# Patient Record
Sex: Male | Born: 1965
Health system: Southern US, Community
[De-identification: ages and names within clinical notes are randomized; demographics above are authoritative.]

## PROBLEM LIST (undated history)

## (undated) DIAGNOSIS — J449 Chronic obstructive pulmonary disease, unspecified: Secondary | ICD-10-CM

## (undated) DIAGNOSIS — I10 Essential (primary) hypertension: Secondary | ICD-10-CM

## (undated) HISTORY — DX: Chronic obstructive pulmonary disease, unspecified: J44.9

## (undated) HISTORY — PX: NO PAST SURGERIES: SHX2092

## (undated) HISTORY — DX: Essential (primary) hypertension: I10

---

## 2018-05-04 ENCOUNTER — Telehealth: Payer: Self-pay | Admitting: General Practice

## 2018-05-04 NOTE — Telephone Encounter (Signed)
Patient left a voicemail to cancel appointment. No reschedule at this time.

## 2018-05-05 ENCOUNTER — Ambulatory Visit: Payer: Self-pay | Admitting: Osteopathic Medicine

## 2018-06-21 ENCOUNTER — Ambulatory Visit (INDEPENDENT_AMBULATORY_CARE_PROVIDER_SITE_OTHER): Payer: BLUE CROSS/BLUE SHIELD | Admitting: Physician Assistant

## 2018-06-21 ENCOUNTER — Encounter: Payer: Self-pay | Admitting: Physician Assistant

## 2018-06-21 VITALS — BP 163/86 | HR 59 | Wt 278.0 lb

## 2018-06-21 DIAGNOSIS — I10 Essential (primary) hypertension: Secondary | ICD-10-CM

## 2018-06-21 DIAGNOSIS — G4733 Obstructive sleep apnea (adult) (pediatric): Secondary | ICD-10-CM

## 2018-06-21 DIAGNOSIS — Z125 Encounter for screening for malignant neoplasm of prostate: Secondary | ICD-10-CM | POA: Diagnosis not present

## 2018-06-21 DIAGNOSIS — Z0000 Encounter for general adult medical examination without abnormal findings: Secondary | ICD-10-CM | POA: Diagnosis not present

## 2018-06-21 DIAGNOSIS — Z13220 Encounter for screening for lipoid disorders: Secondary | ICD-10-CM | POA: Diagnosis not present

## 2018-06-21 DIAGNOSIS — M67431 Ganglion, right wrist: Secondary | ICD-10-CM

## 2018-06-21 DIAGNOSIS — Z1159 Encounter for screening for other viral diseases: Secondary | ICD-10-CM

## 2018-06-21 DIAGNOSIS — Z23 Encounter for immunization: Secondary | ICD-10-CM

## 2018-06-21 DIAGNOSIS — Z131 Encounter for screening for diabetes mellitus: Secondary | ICD-10-CM

## 2018-06-21 DIAGNOSIS — Z1211 Encounter for screening for malignant neoplasm of colon: Secondary | ICD-10-CM

## 2018-06-21 HISTORY — DX: Essential (primary) hypertension: I10

## 2018-06-21 HISTORY — DX: Ganglion, right wrist: M67.431

## 2018-06-21 HISTORY — DX: Obstructive sleep apnea (adult) (pediatric): G47.33

## 2018-06-21 MED ORDER — AMLODIPINE BESYLATE 5 MG PO TABS: 5.0000 mg | ORAL_TABLET | Freq: Every day | ORAL | 1 refills | Status: DC

## 2018-06-21 NOTE — Patient Instructions (Addendum)
Ganglion Cyst  A ganglion cyst is a non-cancerous, fluid-filled lump that occurs near a joint or tendon. The cyst grows out of a joint or the lining of a tendon. Ganglion cysts most often develop in the hand or wrist, but they can also develop in the shoulder, elbow, hip, knee, ankle, or foot. Ganglion cysts are ball-shaped or egg-shaped. Their size can range from the size of a pea to larger than a grape. Increased activity may cause the cyst to get bigger because more fluid starts to build up. What are the causes? The exact cause of this condition is not known, but it may be related to:  Inflammation or irritation around the joint.  An injury.  Repetitive movements or overuse.  Arthritis. What increases the risk? You are more likely to develop this condition if:  You are a woman.  You are 40-59 years old. What are the signs or symptoms? The main symptom of this condition is a lump. It most often appears on the hand or wrist. In many cases, there are no other symptoms, but a cyst can sometimes cause:  Tingling.  Pain.  Numbness.  Muscle weakness.  Weak grip.  Less range of motion in a joint. How is this diagnosed? Ganglion cysts are usually diagnosed based on a physical exam. Your health care provider will feel the lump and may shine a light next to it. If it is a ganglion cyst, the light will likely shine through it. Your health care provider may order an X-ray, ultrasound, or MRI to rule out other conditions. How is this treated? Ganglion cysts often go away on their own without treatment. If you have pain or other symptoms, treatment may be needed. Treatment is also needed if the ganglion cyst limits your movement or if it gets infected. Treatment may include:  Wearing a brace or splint on your wrist or finger.  Taking anti-inflammatory medicine.  Having fluid drained from the lump with a needle (aspiration).  Getting a steroid injected into the joint.  Having  surgery to remove the ganglion cyst.  Placing a pad on your shoe or wearing shoes that will not rub against the cyst if it is on your foot. Follow these instructions at home:  Do not press on the ganglion cyst, poke it with a needle, or hit it.  Take over-the-counter and prescription medicines only as told by your health care provider.  If you have a brace or splint: ? Wear it as told by your health care provider. ? Remove it as told by your health care provider. Ask if you need to remove it when you take a shower or a bath.  Watch your ganglion cyst for any changes.  Keep all follow-up visits as told by your health care provider. This is important. Contact a health care provider if:  Your ganglion cyst becomes larger or more painful.  You have pus coming from the lump.  You have weakness or numbness in the affected area.  You have a fever or chills. Get help right away if:  You have a fever and have any of these in the cyst area: ? Increased redness. ? Red streaks. ? Swelling. Summary  A ganglion cyst is a non-cancerous, fluid-filled lump that occurs near a joint or tendon.  Ganglion cysts most often develop in the hand or wrist, but they can also develop in the shoulder, elbow, hip, knee, ankle, or foot.  Ganglion cysts often go away on their own without treatment.  This information is not intended to replace advice given to you by your health care provider. Make sure you discuss any questions you have with your health care provider. Document Released: 04/23/2000 Document Revised: 12/24/2016 Document Reviewed: 12/24/2016 Elsevier Interactive Patient Education  2019 [place], [place] repens tablets and capsules What is this medicine? SAW PALMETTO (saw pal MET oh) is an herbal or dietary supplement. It is promoted to help support prostate health. The FDA has not approved this supplement for any medical use. This supplement may be used for other purposes;  ask your health care provider or pharmacist if you have questions. This medicine may be used for other purposes; ask your health care provider or pharmacist if you have questions. What should I tell my health care provider before I take this medicine? They need to know if you have any of these conditions: -liver disease -prostate cancer -an unusual or allergic reaction to saw palmetto, other herbs, plants, medicines, foods, dyes, or preservatives -pregnant or trying to get pregnant -breast-feeding How should I use this medicine? Take this supplement by mouth with a glass of water. Follow the directions on the package labeling, or take as directed by your health care professional. If this supplement upsets your stomach, take it with food. Do not take this supplement more often than directed. Contact your pediatrician regarding the use of this supplement in children. Special care may be needed. Overdosage: If you think you have taken too much of this medicine contact a poison control center or emergency room at once. NOTE: This medicine is only for you. Do not share this medicine with others. What if I miss a dose? If you miss a dose, take it as soon as you can. If it is almost time for your next dose, take only that dose. Do not take double or extra doses. What may interact with this medicine? -other medicines for the prostate like finasteride, tamsulosin -hormones -warfarin This list may not describe all possible interactions. Give your health care provider a list of all the medicines, herbs, non-prescription drugs, or dietary supplements you use. Also tell them if you smoke, drink alcohol, or use illegal drugs. Some items may interact with your medicine. What should I watch for while using this medicine? Tell your doctor or healthcare professional if your symptoms do not start to get better or if they get worse. If you are scheduled for any medical or dental procedure, tell your healthcare  provider that you are taking this supplement. You may need to stop taking this supplement before the procedure. Herbal or dietary supplements are not regulated like medicines. Rigid quality control standards are not required for dietary supplements. The purity and strength of these products can vary. The safety and effect of this dietary supplement for a certain disease or illness is not well known. This product is not intended to diagnose, treat, cure or prevent any disease. The Food and Drug Administration suggests the following to help consumers protect themselves: -Always read product labels and follow directions. -Natural does not mean a product is safe for humans to take. -Look for products that include USP after the ingredient name. This means that the manufacturer followed the standards of the [place] Pharmacopoeia. -Supplements made or sold by a nationally known food or drug company are more likely to be made under tight controls. You can write to the company for more information about how the product was made. What side effects may I notice from receiving this  medicine? Side effects that you should report to your doctor or health care professional as soon as possible: -allergic reactions like skin rash, itching or hives, swelling of the face, lips, or tongue -breathing problems -dark urine -fever -general ill feeling or flu-like symptoms -light-colored stools -loss of appetite, nausea -right upper belly pain -trouble passing urine or change in the amount of urine -unusually weak or tired -yellowing of the eyes or skin Side effects that usually do not require medical attention (report to your doctor or health care professional if they continue or are bothersome): -breast tenderness or enlargement -diarrhea -headache -stomach upset This list may not describe all possible side effects. Call your doctor for medical advice about side effects. You may report side effects to FDA at  1-800-FDA-1088. Where should I keep my medicine? Keep out of the reach of children. Store at room temperature between 15 and 30 degrees C (59 and 86 degrees F) or as directed on the package label. Protect from moisture. Throw away any unused supplement after the expiration date. NOTE: This sheet is a summary. It may not cover all possible information. If you have questions about this medicine, talk to your doctor, pharmacist, or health care provider.  2019 Elsevier/Gold Standard (2007-12-28 15:15:00)  Hypertension Hypertension, commonly called high blood pressure, is when the force of blood pumping through the arteries is too strong. The arteries are the blood vessels that carry blood from the heart throughout the body. Hypertension forces the heart to work harder to pump blood and may cause arteries to become narrow or stiff. Having untreated or uncontrolled hypertension can cause heart attacks, strokes, kidney disease, and other problems. A blood pressure reading consists of a higher number over a lower number. Ideally, your blood pressure should be below 120/80. The first ("top") number is called the systolic pressure. It is a measure of the pressure in your arteries as your heart beats. The second ("bottom") number is called the diastolic pressure. It is a measure of the pressure in your arteries as the heart relaxes. What are the causes? The cause of this condition is not known. What increases the risk? Some risk factors for high blood pressure are under your control. Others are not. Factors you can change  Smoking.  Having type 2 diabetes mellitus, high cholesterol, or both.  Not getting enough exercise or physical activity.  Being overweight.  Having too much fat, sugar, calories, or salt (sodium) in your diet.  Drinking too much alcohol. Factors that are difficult or impossible to change  Having chronic kidney disease.  Having a family history of high blood pressure.  Age.  Risk increases with age.  Race. You may be at higher risk if you are African-American.  Gender. Men are at higher risk than women before age 9. After age 62, women are at higher risk than men.  Having obstructive sleep apnea.  Stress. What are the signs or symptoms? Extremely high blood pressure (hypertensive crisis) may cause:  Headache.  Anxiety.  Shortness of breath.  Nosebleed.  Nausea and vomiting.  Severe chest pain.  Jerky movements you cannot control (seizures). How is this diagnosed? This condition is diagnosed by measuring your blood pressure while you are seated, with your arm resting on a surface. The cuff of the blood pressure monitor will be placed directly against the skin of your upper arm at the level of your heart. It should be measured at least twice using the same arm. Certain conditions can cause a  difference in blood pressure between your right and left arms. Certain factors can cause blood pressure readings to be lower or higher than normal (elevated) for a short period of time:  When your blood pressure is higher when you are in a health care provider's office than when you are at home, this is called white coat hypertension. Most people with this condition do not need medicines.  When your blood pressure is higher at home than when you are in a health care provider's office, this is called masked hypertension. Most people with this condition may need medicines to control blood pressure. If you have a high blood pressure reading during one visit or you have normal blood pressure with other risk factors:  You may be asked to return on a different day to have your blood pressure checked again.  You may be asked to monitor your blood pressure at home for 1 week or longer. If you are diagnosed with hypertension, you may have other blood or imaging tests to help your health care provider understand your overall risk for other conditions. How is this  treated? This condition is treated by making healthy lifestyle changes, such as eating healthy foods, exercising more, and reducing your alcohol intake. Your health care provider may prescribe medicine if lifestyle changes are not enough to get your blood pressure under control, and if:  Your systolic blood pressure is above 130.  Your diastolic blood pressure is above 80. Your personal target blood pressure may vary depending on your medical conditions, your age, and other factors. Follow these instructions at home: Eating and drinking   Eat a diet that is high in fiber and potassium, and low in sodium, added sugar, and fat. An example eating plan is called the DASH (Dietary Approaches to Stop Hypertension) diet. To eat this way: ? Eat plenty of fresh fruits and vegetables. Try to fill half of your plate at each meal with fruits and vegetables. ? Eat whole grains, such as whole wheat pasta, brown rice, or whole grain bread. Fill about one quarter of your plate with whole grains. ? Eat or drink low-fat dairy products, such as skim milk or low-fat yogurt. ? Avoid fatty cuts of meat, processed or cured meats, and poultry with skin. Fill about one quarter of your plate with lean proteins, such as fish, chicken without skin, beans, eggs, and tofu. ? Avoid premade and processed foods. These tend to be higher in sodium, added sugar, and fat.  Reduce your daily sodium intake. Most people with hypertension should eat less than 1,500 mg of sodium a day.  Limit alcohol intake to no more than 1 drink a day for nonpregnant women and 2 drinks a day for men. One drink equals 12 oz of beer, 5 oz of wine, or 1 oz of hard liquor. Lifestyle   Work with your health care provider to maintain a healthy body weight or to lose weight. Ask what an ideal weight is for you.  Get at least 30 minutes of exercise that causes your heart to beat faster (aerobic exercise) most days of the week. Activities may include  walking, swimming, or biking.  Include exercise to strengthen your muscles (resistance exercise), such as pilates or lifting weights, as part of your weekly exercise routine. Try to do these types of exercises for 30 minutes at least 3 days a week.  Do not use any products that contain nicotine or tobacco, such as cigarettes and e-cigarettes. If you need help  quitting, ask your health care provider.  Monitor your blood pressure at home as told by your health care provider.  Keep all follow-up visits as told by your health care provider. This is important. Medicines  Take over-the-counter and prescription medicines only as told by your health care provider. Follow directions carefully. Blood pressure medicines must be taken as prescribed.  Do not skip doses of blood pressure medicine. Doing this puts you at risk for problems and can make the medicine less effective.  Ask your health care provider about side effects or reactions to medicines that you should watch for. Contact a health care provider if:  You think you are having a reaction to a medicine you are taking.  You have headaches that keep coming back (recurring).  You feel dizzy.  You have swelling in your ankles.  You have trouble with your vision. Get help right away if:  You develop a severe headache or confusion.  You have unusual weakness or numbness.  You feel faint.  You have severe pain in your chest or abdomen.  You vomit repeatedly.  You have trouble breathing. Summary  Hypertension is when the force of blood pumping through your arteries is too strong. If this condition is not controlled, it may put you at risk for serious complications.  Your personal target blood pressure may vary depending on your medical conditions, your age, and other factors. For most people, a normal blood pressure is less than 120/80.  Hypertension is treated with lifestyle changes, medicines, or a combination of both. Lifestyle  changes include weight loss, eating a healthy, low-sodium diet, exercising more, and limiting alcohol. This information is not intended to replace advice given to you by your health care provider. Make sure you discuss any questions you have with your health care provider. Document Released: 04/26/2005 Document Revised: 03/24/2016 Document Reviewed: 03/24/2016 Elsevier Interactive Patient Education  2019 [employer].

## 2018-06-21 NOTE — Progress Notes (Signed)
New Patient Office Visit  Subjective:  Patient ID: Ernest Fuentes, male    DOB: November 20, 1965  Age: 53 y.o. MRN: [id number]  CC:  Chief Complaint  Patient presents with  . Establish Care    HPI Ernest Fuentes presents for establish care.   He "just wants to get healthy".   He does want me to evaluate a mass on his right wrist that has been there for about 1 year. Not bothersome. He works as a Scientist, water quality.   Hx of OSA does not use CPAP. Hated it. Is working on weight loss he used to be over 300lbs.   BP elevated today. No CP, headaches, palpitations, vision changes. Never been on medication. Mother and father both had hypertension. His sister just had a stroke.    Past Medical History:  Diagnosis Date  . COPD (chronic obstructive pulmonary disease) (HCC)   . Hypertension     History reviewed. No pertinent surgical history.  Family History  Problem Relation Age of Onset  . Hypertension Other   . Diabetes Other   . Cancer Other   . Hypertension Mother   . Stroke Father   . Hypertension Father   . Stroke Sister     Social History   Socioeconomic History  . Marital status: Single    Spouse name: Not on file  . Number of children: Not on file  . Years of education: Not on file  . Highest education level: Not on file  Occupational History  . Not on file  Social Needs  . Financial resource strain: Not on file  . Food insecurity:    Worry: Not on file    Inability: Not on file  . Transportation needs:    Medical: Not on file    Non-medical: Not on file  Tobacco Use  . Smoking status: Former Games developer  . Smokeless tobacco: Current User  Substance and Sexual Activity  . Alcohol use: Yes  . Drug use: Never  . Sexual activity: Yes  Lifestyle  . Physical activity:    Days per week: Not on file    Minutes per session: Not on file  . Stress: Not on file  Relationships  . Social connections:    Talks on phone: Not on file    Gets together: Not on file    Attends  religious service: Not on file    Active member of club or organization: Not on file    Attends meetings of clubs or organizations: Not on file    Relationship status: Not on file  . Intimate partner violence:    Fear of current or ex partner: Not on file    Emotionally abused: Not on file    Physically abused: Not on file    Forced sexual activity: Not on file  Other Topics Concern  . Not on file  Social History Narrative  . Not on file    ROS Review of Systems  All other systems reviewed and are negative.   Objective:   Today's Vitals: BP (!) 163/86   Pulse (!) 59   Wt 278 lb (126.1 kg)   Physical Exam Vitals signs reviewed.  Constitutional:      Appearance: Normal appearance. He is obese.  HENT:     Head: Normocephalic and atraumatic.  Neck:     Musculoskeletal: Normal range of motion.  Cardiovascular:     Rate and Rhythm: Normal rate and regular rhythm.     Pulses: Normal  pulses.     Heart sounds: Normal heart sounds. No murmur.  Pulmonary:     Effort: Pulmonary effort is normal.     Breath sounds: Normal breath sounds.  Lymphadenopathy:     Cervical: No cervical adenopathy.  Skin:    Comments: 1.5cm by 1.5cm mobile cyst of dorsum of right wrist.   Neurological:     General: No focal deficit present.     Mental Status: He is alert and oriented to person, place, and time.  Psychiatric:        Mood and Affect: Mood normal.        Behavior: Behavior normal.     Assessment & Plan:  .[name] Depression screen [facility] 2/9 06/21/2018  Decreased Interest 2  Down, Depressed, Hopeless 0  PHQ - 2 Score 2  Altered sleeping 0  Tired, decreased energy 0  Change in appetite 0  Feeling bad or failure about yourself  0  Trouble concentrating 0  Moving slowly or fidgety/restless 0  Suicidal thoughts 0  PHQ-9 Score 2  Difficult doing work/chores Not difficult at all    Problem List Items Addressed This Visit      Unprioritized   OSA (obstructive sleep apnea)    Ganglion cyst of dorsum of right wrist    Other Visit Diagnoses    Essential hypertension    -  Primary   Relevant Medications   amLODipine (NORVASC) 5 MG tablet   Screening for diabetes mellitus       Relevant Orders   COMPLETE METABOLIC PANEL WITH GFR   Screening for lipid disorders       Relevant Orders   Lipid Panel w/reflex Direct LDL   Colon cancer screening       Relevant Orders   Cologuard   Need for hepatitis C screening test       Relevant Orders   Hepatitis C Antibody   Prostate cancer screening       Relevant Orders   PSA   Preventative health care       Relevant Orders   Lipid Panel w/reflex Direct LDL   COMPLETE METABOLIC PANEL WITH GFR   Cologuard   Hepatitis C Antibody   PSA   CBC   Need for shingles vaccine       Relevant Orders   Varicella-zoster vaccine IM (Shingrix) (Completed)      Outpatient Encounter Medications as of 06/21/2018  Medication Sig  . amLODipine (NORVASC) 5 MG tablet Take 1 tablet (5 mg total) by mouth daily.   No facility-administered encounter medications on file as of 06/21/2018.    Discussed BP. Needs to start medication. Given CCB.Discussed low salt diet. Follow up in 1 month. Continue to walk and work on weight loss. OSA can contribute to HTN. Pt refuses evaluation and use of CPAP.   Fasting labs ordered.   Pt agreed to shingles vaccine.   Discussed benign ganglion cyst. HO given. Follow up with sports medicine if would like drained.   Pt declined colonoscopy but agreed to cologuard. No person hx or family hx of colon cancer.   Follow-up: Return in about 4 weeks (around 07/19/2018).   Tandy Gaw, PA-C

## 2018-06-22 LAB — CBC
HCT: 42.5 % (ref 38.5–50.0)
HEMOGLOBIN: 14.3 g/dL (ref 13.2–17.1)
MCH: 30.6 pg (ref 27.0–33.0)
MCHC: 33.6 g/dL (ref 32.0–36.0)
MCV: 91 fL (ref 80.0–100.0)
MPV: 11.6 fL (ref 7.5–12.5)
Platelets: 288 10*3/uL (ref 140–400)
RBC: 4.67 10*6/uL (ref 4.20–5.80)
RDW: 11.9 % (ref 11.0–15.0)
WBC: 6.5 10*3/uL (ref 3.8–10.8)

## 2018-06-22 LAB — COMPLETE METABOLIC PANEL WITH GFR
AG Ratio: 1.2 (calc) (ref 1.0–2.5)
ALT: 14 U/L (ref 9–46)
AST: 21 U/L (ref 10–35)
Albumin: 4.2 g/dL (ref 3.6–5.1)
Alkaline phosphatase (APISO): 107 U/L (ref 35–144)
BUN: 12 mg/dL (ref 7–25)
CO2: 30 mmol/L (ref 20–32)
Calcium: 10 mg/dL (ref 8.6–10.3)
Chloride: 103 mmol/L (ref 98–110)
Creat: 1 mg/dL (ref 0.70–1.33)
GFR, Est African American: 98 mL/min/{1.73_m2} (ref >=60)
GFR, Est Non African American: 85 mL/min/{1.73_m2} (ref >=60)
Globulin: 3.4 g/dL (calc) (ref 1.9–3.7)
Glucose, Bld: 100 mg/dL — ABNORMAL HIGH (ref 65–99)
Potassium: 4.6 mmol/L (ref 3.5–5.3)
Sodium: 139 mmol/L (ref 135–146)
Total Bilirubin: 0.8 mg/dL (ref 0.2–1.2)
Total Protein: 7.6 g/dL (ref 6.1–8.1)

## 2018-06-22 LAB — PSA: PSA: 0.8 ng/mL (ref <=4.0)

## 2018-06-22 LAB — LIPID PANEL W/REFLEX DIRECT LDL
CHOLESTEROL: 226 mg/dL — AB (ref <200)
HDL: 42 mg/dL (ref >=40)
LDL CHOLESTEROL (CALC): 162 mg/dL — AB
Non-HDL Cholesterol (Calc): 184 mg/dL (calc) — ABNORMAL HIGH (ref <130)
Total CHOL/HDL Ratio: 5.4 (calc) — ABNORMAL HIGH (ref <5.0)
Triglycerides: 102 mg/dL (ref <150)

## 2018-06-22 LAB — HEPATITIS C ANTIBODY
Hepatitis C Ab: NONREACTIVE
SIGNAL TO CUT-OFF: 0.19 (ref <1.00)

## 2018-06-23 ENCOUNTER — Encounter: Payer: Self-pay | Admitting: Physician Assistant

## 2018-06-23 DIAGNOSIS — E785 Hyperlipidemia, unspecified: Secondary | ICD-10-CM

## 2018-06-23 DIAGNOSIS — F17200 Nicotine dependence, unspecified, uncomplicated: Secondary | ICD-10-CM | POA: Insufficient documentation

## 2018-06-23 HISTORY — DX: Hyperlipidemia, unspecified: E78.5

## 2018-06-23 NOTE — Progress Notes (Signed)
Call pt: your cholesterol is elevated and with high blood pressure and smoking that puts your overall 10 year CV risk at 19.3 percent. We should start a cholesterol lowering drug called lipitor. Can I send to the pharmacy?  Kidney, liver, glucose look good.  PSA looks good.

## 2018-06-26 ENCOUNTER — Other Ambulatory Visit: Payer: Self-pay

## 2018-06-26 MED ORDER — AMLODIPINE BESYLATE 5 MG PO TABS: 5.0000 mg | ORAL_TABLET | Freq: Every day | ORAL | 1 refills | Status: DC

## 2018-06-30 ENCOUNTER — Telehealth: Payer: Self-pay

## 2018-06-30 ENCOUNTER — Other Ambulatory Visit: Payer: Self-pay | Admitting: Physician Assistant

## 2018-06-30 MED ORDER — ATORVASTATIN CALCIUM 40 MG PO TABS: 40.0000 mg | ORAL_TABLET | Freq: Every day | ORAL | 3 refills | Status: DC

## 2018-06-30 NOTE — Telephone Encounter (Signed)
Patient advised.

## 2018-06-30 NOTE — Telephone Encounter (Signed)
Sent!

## 2018-06-30 NOTE — Telephone Encounter (Signed)
Lemon walked in asking about the cholesterol lowering medication. He was told it would be sent in. Please advise.

## 2018-06-30 NOTE — Progress Notes (Signed)
l °

## 2018-07-13 DIAGNOSIS — T466X5A Adverse effect of antihyperlipidemic and antiarteriosclerotic drugs, initial encounter: Secondary | ICD-10-CM | POA: Diagnosis not present

## 2018-07-13 DIAGNOSIS — R21 Rash and other nonspecific skin eruption: Secondary | ICD-10-CM | POA: Diagnosis not present

## 2018-07-13 DIAGNOSIS — E785 Hyperlipidemia, unspecified: Secondary | ICD-10-CM | POA: Diagnosis not present

## 2018-07-13 DIAGNOSIS — Z79899 Other long term (current) drug therapy: Secondary | ICD-10-CM | POA: Diagnosis not present

## 2018-07-13 DIAGNOSIS — T7840XA Allergy, unspecified, initial encounter: Secondary | ICD-10-CM | POA: Diagnosis not present

## 2018-07-13 DIAGNOSIS — I10 Essential (primary) hypertension: Secondary | ICD-10-CM | POA: Diagnosis not present

## 2018-07-14 ENCOUNTER — Encounter: Payer: Self-pay | Admitting: Osteopathic Medicine

## 2018-07-14 ENCOUNTER — Ambulatory Visit (INDEPENDENT_AMBULATORY_CARE_PROVIDER_SITE_OTHER): Payer: BLUE CROSS/BLUE SHIELD | Admitting: Osteopathic Medicine

## 2018-07-14 VITALS — BP 134/73 | HR 71 | Temp 98.2°F | Wt 277.1 lb

## 2018-07-14 DIAGNOSIS — E782 Mixed hyperlipidemia: Secondary | ICD-10-CM | POA: Diagnosis not present

## 2018-07-14 DIAGNOSIS — T466X5A Adverse effect of antihyperlipidemic and antiarteriosclerotic drugs, initial encounter: Secondary | ICD-10-CM | POA: Diagnosis not present

## 2018-07-14 MED ORDER — PRAVASTATIN SODIUM 20 MG PO TABS: 20.0000 mg | ORAL_TABLET | Freq: Every day | ORAL | 1 refills | Status: DC

## 2018-07-14 NOTE — Progress Notes (Signed)
HPI: Ernest Fuentes is a 53 y.o. male who  has a past medical history of COPD (chronic obstructive pulmonary disease) (HCC) and Hypertension.  he presents to [facility] today, 07/14/18,  for chief complaint of:  Rash w/ atorvastatin   Started atorvastatin, rash developed, stopped and rash resolved, restarted and rash came back a bit worse.  He went to the ER last night and was treated with steroid shot, prescribed prednisone taper.  He is feeling a lot better now.    At today's visit 07/14/18 ... PMH, PSH, FH reviewed and updated as needed.  Current medication list and allergy/intolerance hx reviewed and updated as needed. (See remainder of HPI, ROS, Phys Exam below)  Allergies  Allergen Reactions  . Atorvastatin Rash             ASSESSMENT/PLAN: The primary encounter diagnosis was Adverse effect of statin. A diagnosis of Mixed hyperlipidemia was also pertinent to this visit.   Rash with atorvastatin but no signs of anaphylaxis.  Discussed option of trying another oral statin medication, which may risk another reaction, versus trying 1 of the PCSK9 inhibitors such as Repatha or Praluent.  Patient states he would rather risk another oral medication first.  We will go ahead and send pravastatin.  Plan to follow-up with [facility] after getting labs done to recheck cholesterol in 4 to 6 weeks or so.  Alert [place] ASAP if any issues with pravastatin medication.  Orders Placed This Encounter  Procedures  . COMPLETE METABOLIC PANEL WITH GFR  . Lipid panel     Meds ordered this encounter  Medications  . pravastatin (PRAVACHOL) 20 MG tablet    Sig: Take 1 tablet (20 mg total) by mouth daily.    Dispense:  30 tablet    Refill:  1   The 10-year ASCVD risk score Ernest Fuentes., et al., 2013) is: 12.4%   Values used to calculate the score:     Age: 35 years     Sex: Male     Is Non-Hispanic African American: Yes     Diabetic: No     Tobacco smoker:  No     Systolic Blood Pressure: 134 mmHg     Is BP treated: Yes     HDL Cholesterol: 42 mg/dL     Total Cholesterol: 226 mg/dL   Patient Instructions  We will try different oral medication to help treat high cholesterol and reduce long-term risk of heart attack/stroke.  Given your reaction to atorvastatin, you may be at risk of developing a similar reaction with pravastatin.  If rash or other concerning symptoms develop, please seek medical care ASAP!  Other good option would be injectable medications, option of Praluent or Repatha.  Please let us know if you would like to try 1 of these medications, typically an injection that patients can administer at home every 2 to 4 weeks.         Follow-up plan: Return in about 6 weeks (around 08/25/2018) for lab visit only, then follow-up with Ernest Fuentes in 3-5 days after blood draw. See [place] or call sooner if need.                                                 ################################################# ################################################# ################################################# #################################################    Current Meds  Medication Sig  .  amLODipine (NORVASC) 5 MG tablet Take 1 tablet (5 mg total) by mouth daily.    Allergies  Allergen Reactions  . Atorvastatin Rash       Review of Systems:  Constitutional: No fever/chills  HEENT: No  headache, no vision change  Cardiac: No  chest pain, No  pressure, No palpitations  Respiratory:  No  shortness of breath. No  Cough  Gastrointestinal: No  abdominal pain  Musculoskeletal: No new myalgia/arthralgia  Skin: +Rash   Exam:  BP 134/73 (BP Location: Left Arm, Patient Position: Sitting, Cuff Size: Large)   Pulse 71   Temp 98.2 F (36.8 C) (Oral)   Wt 277 lb 1.6 oz (125.7 kg)   Constitutional: VS see above. General Appearance: alert, well-developed, well-nourished, NAD  Eyes:  Normal lids and conjunctive, non-icteric sclera  Ears, Nose, Mouth, Throat: MMM, Normal external inspection ears/nares/mouth/lips/gums.  Neck: No masses, trachea midline.   Respiratory: Normal respiratory effort. no wheeze, no rhonchi, no rales  Cardiovascular: S1/S2 normal, no murmur, no rub/gallop auscultated. RRR.   Musculoskeletal: Gait normal. Symmetric and independent movement of all extremities  Neurological: Normal balance/coordination. No tremor.  Skin: warm, dry, intact. Mild hives reaction on forearms   Psychiatric: Normal judgment/insight. Normal mood and affect. Oriented x3.       Visit summary with medication list and pertinent instructions was printed for patient to review, patient was advised to alert [place] if any updates are needed. All questions at time of visit were answered - patient instructed to contact office with any additional concerns. ER/RTC precautions were reviewed with the patient and understanding verbalized.    Please note: voice recognition software was used to produce this document, and typos may escape review. Please contact Dr. Lyn Fuentes for any needed clarifications.    Follow up plan: Return in about 6 weeks (around 08/25/2018) for lab visit only, then follow-up with Ernest Fuentes in 3-5 days after blood draw. See [place] or call sooner if need.

## 2018-07-14 NOTE — Patient Instructions (Signed)
We will try different oral medication to help treat high cholesterol and reduce long-term risk of heart attack/stroke.  Given your reaction to atorvastatin, you may be at risk of developing a similar reaction with pravastatin.  If rash or other concerning symptoms develop, please seek medical care ASAP!  Other good option would be injectable medications, option of Praluent or Repatha.  Please let us know if you would like to try 1 of these medications, typically an injection that patients can administer at home every 2 to 4 weeks.

## 2018-08-15 ENCOUNTER — Ambulatory Visit (INDEPENDENT_AMBULATORY_CARE_PROVIDER_SITE_OTHER): Payer: BLUE CROSS/BLUE SHIELD | Admitting: Osteopathic Medicine

## 2018-08-15 ENCOUNTER — Encounter: Payer: Self-pay | Admitting: Osteopathic Medicine

## 2018-08-15 VITALS — BP 165/85 | HR 66 | Temp 98.1°F | Wt 268.6 lb

## 2018-08-15 DIAGNOSIS — R32 Unspecified urinary incontinence: Secondary | ICD-10-CM

## 2018-08-15 LAB — POCT URINALYSIS DIPSTICK
Bilirubin, UA: NEGATIVE
Blood, UA: NEGATIVE
Glucose, UA: NEGATIVE
Ketones, UA: NEGATIVE
Leukocytes, UA: NEGATIVE
Nitrite, UA: NEGATIVE
Protein, UA: NEGATIVE
Spec Grav, UA: 1.015 (ref 1.010–1.025)
Urobilinogen, UA: 0.2 E.U./dL
pH, UA: 6.5 (ref 5.0–8.0)

## 2018-08-15 MED ORDER — TERAZOSIN HCL 5 MG PO CAPS: 5.0000 mg | ORAL_CAPSULE | Freq: Every day | ORAL | 0 refills | Status: DC

## 2018-08-15 NOTE — Progress Notes (Signed)
HPI: Ernest Fuentes is a 53 y.o. male who  has a past medical history of COPD (chronic obstructive pulmonary disease) (HCC) and Hypertension.  he presents to [facility] today, 08/15/18,  for chief complaint of:  Urinary problem    . Quality: Urinary urgency and occasional incontinence, epidose of enuresis last night.  . Context: no hx BPH or other urinary issues, only meds are amlodipine and pravastatin, no recent OTC meds, recent PSA and renal fxn 1 month ago was WNL.  . Severity/Duration: 2 weeks, stable over that time. . Assoc signs/symptoms: Low back pain soreness but no injury, no saddle anesthesia, no stool incontinence.        At today's visit 08/15/18 ... PMH, PSH, FH reviewed and updated as needed.  Current medication list and allergy/intolerance hx reviewed and updated as needed. (See remainder of HPI, ROS, Phys Exam below)        ASSESSMENT/PLAN: The encounter diagnosis was Urinary incontinence, unspecified type.   Orders Placed This Encounter  Procedures  . Urine Culture  . Urinalysis, microscopic only  . COMPLETE METABOLIC PANEL WITH GFR  . PSA, Total with Reflex to PSA, Free  . Ambulatory referral to Urology  . POCT Urinalysis Dipstick     Meds ordered this encounter  Medications  . terazosin (HYTRIN) 5 MG capsule    Sig: Take 1 capsule (5 mg total) by mouth at bedtime.    Dispense:  90 capsule    Refill:  0    Patient Instructions  Urinary incontinence concerning for bladder spasm, overflow incontinence due to enlarged prostate or other urinary retention, urinary tract infection, other possible causes. I think we should start a medication for possible prostate problems, this medicine will also help blood pressure. Let's wait for urine and blood tests to come back, and let's get you seen by a urologist (bladder specialist) for further evaluation.        Follow-up plan: Return for recheck BP as directed with  Lesly Rubenstein, see [place] sooner if needed .                                                 ################################################# ################################################# ################################################# #################################################    Current Meds  Medication Sig  . amLODipine (NORVASC) 5 MG tablet Take 1 tablet (5 mg total) by mouth daily.  . pravastatin (PRAVACHOL) 20 MG tablet Take 1 tablet (20 mg total) by mouth daily.    Allergies  Allergen Reactions  . Atorvastatin Rash       Review of Systems:  Constitutional: No recent illness, no fever/shilld  HEENT: No  headache, no vision change  Cardiac: No  chest pain, No  pressure, No palpitations  Respiratory:  No  shortness of breath. No  Cough  Gastrointestinal: No  abdominal pain, no change on bowel habits  Musculoskeletal: No new myalgia/arthralgia except mild low back pain   Skin: No  Rash  Hem/Onc: No  easy bruising/bleeding, No  abnormal lumps/bumps  Neurologic: No  weakness, No  Dizziness  Psychiatric: No  concerns with depression, No  concerns with anxiety  Exam:  BP (!) 165/85 (BP Location: Left Arm, Patient Position: Sitting, Cuff Size: Large)   Pulse 66   Temp 98.1 F (36.7 C) (Oral)   Wt 268 lb 9.6 oz (121.8 kg)   Constitutional: VS see  above. General Appearance: alert, well-developed, well-nourished, NAD  Eyes: Normal lids and conjunctive, non-icteric sclera  Ears, Nose, Mouth, Throat: MMM, Normal external inspection ears/nares/mouth/lips/gums.  Neck: No masses, trachea midline.   Respiratory: Normal respiratory effort.  Musculoskeletal: Gait normal. Symmetric and independent movement of all extremities  Abdominal: non-tender, non-distended. Rectal exam normal tone, normal prostate   Neurological: Normal balance/coordination. No tremor.  Skin: warm, dry, intact.   Psychiatric: Normal  judgment/insight. Normal mood and affect. Oriented x3.       Visit summary with medication list and pertinent instructions was printed for patient to review, patient was advised to alert us if any updates are needed. All questions at time of visit were answered - patient instructed to contact office with any additional concerns. ER/RTC precautions were reviewed with the patient and understanding verbalized.      Please note: voice recognition software was used to produce this document, and typos may escape review. Please contact Dr. Lyn HollingsheadAlexander for any needed clarifications.    Follow up plan: Return for recheck BP as directed with [name], see us sooner if needed .

## 2018-08-15 NOTE — Patient Instructions (Signed)
Urinary incontinence concerning for bladder spasm, overflow incontinence due to enlarged prostate or other urinary retention, urinary tract infection, other possible causes. I think we should start a medication for possible prostate problems, this medicine will also help blood pressure. Let's wait for urine and blood tests to come back, and let's get you seen by a urologist (bladder specialist) for further evaluation.

## 2018-08-16 LAB — COMPLETE METABOLIC PANEL WITH GFR
AG Ratio: 1.2 (calc) (ref 1.0–2.5)
ALT: 10 U/L (ref 9–46)
AST: 22 U/L (ref 10–35)
Albumin: 4.1 g/dL (ref 3.6–5.1)
Alkaline phosphatase (APISO): 109 U/L (ref 35–144)
BUN: 12 mg/dL (ref 7–25)
CO2: 29 mmol/L (ref 20–32)
Calcium: 9.8 mg/dL (ref 8.6–10.3)
Chloride: 103 mmol/L (ref 98–110)
Creat: 0.97 mg/dL (ref 0.70–1.33)
GFR, Est African American: 102 mL/min/{1.73_m2} (ref >=60)
GFR, Est Non African American: 88 mL/min/{1.73_m2} (ref >=60)
Globulin: 3.3 g/dL (calc) (ref 1.9–3.7)
Glucose, Bld: 98 mg/dL (ref 65–99)
Potassium: 4.6 mmol/L (ref 3.5–5.3)
Sodium: 139 mmol/L (ref 135–146)
Total Bilirubin: 0.9 mg/dL (ref 0.2–1.2)
Total Protein: 7.4 g/dL (ref 6.1–8.1)

## 2018-08-16 LAB — URINALYSIS, MICROSCOPIC ONLY
Bacteria, UA: NONE SEEN /HPF
Hyaline Cast: NONE SEEN /LPF
RBC / HPF: NONE SEEN /HPF (ref 0–2)
Squamous Epithelial / LPF: NONE SEEN /HPF (ref <=5)
WBC, UA: NONE SEEN /HPF (ref 0–5)

## 2018-08-16 LAB — URINE CULTURE
MICRO NUMBER:: 380826
Result:: NO GROWTH
SPECIMEN QUALITY:: ADEQUATE

## 2018-08-16 LAB — PSA, TOTAL WITH REFLEX TO PSA, FREE: PSA, Total: 1.4 ng/mL (ref <=4.0)

## 2018-08-22 ENCOUNTER — Ambulatory Visit: Payer: BLUE CROSS/BLUE SHIELD | Admitting: Physician Assistant

## 2018-08-22 ENCOUNTER — Encounter: Payer: Self-pay | Admitting: Neurology

## 2018-08-22 ENCOUNTER — Telehealth: Payer: Self-pay | Admitting: Neurology

## 2018-08-22 NOTE — Telephone Encounter (Signed)
Received note from [employer] that patient has not completed Cologuard and they have been unable to reach him. Letter mailed to patient to encourage him to complete testing and reach out to us/the company with any questions.

## 2018-09-04 ENCOUNTER — Other Ambulatory Visit: Payer: Self-pay | Admitting: Physician Assistant

## 2018-09-11 ENCOUNTER — Ambulatory Visit: Payer: BLUE CROSS/BLUE SHIELD | Admitting: Physician Assistant

## 2018-09-11 ENCOUNTER — Encounter: Payer: Self-pay | Admitting: Physician Assistant

## 2018-09-11 VITALS — BP 131/72 | HR 64 | Temp 98.4°F | Ht 72.0 in | Wt 273.0 lb

## 2018-09-11 DIAGNOSIS — L240 Irritant contact dermatitis due to detergents: Secondary | ICD-10-CM

## 2018-09-11 DIAGNOSIS — Z8042 Family history of malignant neoplasm of prostate: Secondary | ICD-10-CM

## 2018-09-11 DIAGNOSIS — R32 Unspecified urinary incontinence: Secondary | ICD-10-CM

## 2018-09-11 DIAGNOSIS — I10 Essential (primary) hypertension: Secondary | ICD-10-CM

## 2018-09-11 DIAGNOSIS — E782 Mixed hyperlipidemia: Secondary | ICD-10-CM

## 2018-09-11 HISTORY — DX: Family history of malignant neoplasm of prostate: Z80.42

## 2018-09-11 MED ORDER — AMLODIPINE BESYLATE 5 MG PO TABS: 5.0000 mg | ORAL_TABLET | Freq: Every day | ORAL | 1 refills | Status: DC

## 2018-09-11 MED ORDER — TRIAMCINOLONE ACETONIDE 0.1 % EX CREA: 1.0000 "application " | TOPICAL_CREAM | Freq: Two times a day (BID) | CUTANEOUS | 0 refills | Status: DC

## 2018-09-11 MED ORDER — PRAVASTATIN SODIUM 20 MG PO TABS: 20.0000 mg | ORAL_TABLET | Freq: Every day | ORAL | 3 refills | Status: DC

## 2018-09-11 NOTE — Patient Instructions (Signed)
Contact Dermatitis  Dermatitis is redness, soreness, and swelling (inflammation) of the skin. Contact dermatitis is a reaction to something that touches the skin.  There are two types of contact dermatitis:   Irritant contact dermatitis. This happens when something bothers (irritates) your skin, like soap.   Allergic contact dermatitis. This is caused when you are exposed to something that you are allergic to, such as poison ivy.  What are the causes?   Common causes of irritant contact dermatitis include:  ? Makeup.  ? Soaps.  ? Detergents.  ? Bleaches.  ? Acids.  ? Metals, such as nickel.   Common causes of allergic contact dermatitis include:  ? Plants.  ? Chemicals.  ? Jewelry.  ? Latex.  ? Medicines.  ? Preservatives in products, such as clothing.  What increases the risk?   Having a job that exposes you to things that bother your skin.   Having asthma or eczema.  What are the signs or symptoms?  Symptoms may happen anywhere the irritant has touched your skin. Symptoms include:   Dry or flaky skin.   Redness.   Cracks.   Itching.   Pain or a burning feeling.   Blisters.   Blood or clear fluid draining from skin cracks.  With allergic contact dermatitis, swelling may occur. This may happen in places such as the eyelids, mouth, or genitals.  How is this treated?   This condition is treated by checking for the cause of the reaction and protecting your skin. Treatment may also include:  ? Steroid creams, ointments, or medicines.  ? Antibiotic medicines or other ointments, if you have a skin infection.  ? Lotion or medicines to help with itching.  ? A bandage (dressing).  Follow these instructions at home:  Skin care   Moisturize your skin as needed.   Put cool cloths on your skin.   Put a baking soda paste on your skin. Stir water into baking soda until it looks like a paste.   Do not scratch your skin.   Avoid having things rub up against your skin.   Avoid the use of soaps, perfumes, and  dyes.  Medicines   Take or apply over-the-counter and prescription medicines only as told by your doctor.   If you were prescribed an antibiotic medicine, take or apply it as told by your doctor. Do not stop using it even if your condition starts to get better.  Bathing   Take a bath with:  ? Epsom salts.  ? Baking soda.  ? Colloidal oatmeal.   Bathe less often.   Bathe in warm water. Avoid using hot water.  Bandage care   If you were given a bandage, change it as told by your health care provider.   Wash your hands with soap and water before and after you change your bandage. If soap and water are not available, use hand sanitizer.  General instructions   Avoid the things that caused your reaction. If you do not know what caused it, keep a journal. Write down:  ? What you eat.  ? What skin products you use.  ? What you drink.  ? What you wear in the area that has symptoms. This includes jewelry.   Check the affected areas every day for signs of infection. Check for:  ? More redness, swelling, or pain.  ? More fluid or blood.  ? Warmth.  ? Pus or a bad smell.   Keep all follow-up visits as   told by your doctor. This is important.  Contact a doctor if:   You do not get better with treatment.   Your condition gets worse.   You have signs of infection, such as:  ? More swelling.  ? Tenderness.  ? More redness.  ? Soreness.  ? Warmth.   You have a fever.   You have new symptoms.  Get help right away if:   You have a very bad headache.   You have neck pain.   Your neck is stiff.   You throw up (vomit).   You feel very sleepy.   You see red streaks coming from the area.   Your bone or joint near the area hurts after the skin has healed.   The area turns darker.   You have trouble breathing.  Summary   Dermatitis is redness, soreness, and swelling of the skin.   Symptoms may occur where the irritant has touched you.   Treatment may include medicines and skin care.   If you do not know what caused  your reaction, keep a journal.   Contact a doctor if your condition gets worse or you have signs of infection.  This information is not intended to replace advice given to you by your health care provider. Make sure you discuss any questions you have with your health care provider.  Document Released: 02/21/2009 Document Revised: 11/09/2017 Document Reviewed: 11/09/2017  Elsevier Interactive Patient Education  2019 Elsevier Inc.

## 2018-09-11 NOTE — Progress Notes (Signed)
   Subjective:    Patient ID: Ernest Fuentes, male    DOB: [birth year], 53 y.o.   MRN: [id number]  HPI  Pt is a 53 yo male with HTN, HLD, urinary incontinence who presents to the clinic for follow up and to discussed intermittent rash.   He reports urinary symptoms are much better. No problems or concerns. Taking terazosin daily has improved flow and incontinence. He has appt with urology later this week. Father did have prostate cancer.   Rash comes up intermittently and very itchy. He does wear long/high socks and sweats a lot. Seems to be happening more with the warm weather. He does admit his girlfriend has switched detergents recently as well. In a few days without wearing socks clears up.   No CP, palpitations, headaches or vision changes.  He is tolerating statin very well.   .. Family History  Problem Relation Age of Onset  . Hypertension Other   . Diabetes Other   . Cancer Other   . Hypertension Mother   . Stroke Father   . Hypertension Father   . Prostate cancer Father   . Stroke Sister      Review of Systems  All other systems reviewed and are negative.      Objective:   Physical Exam Vitals signs reviewed.  Constitutional:      Appearance: Normal appearance.  HENT:     Head: Normocephalic.  Cardiovascular:     Rate and Rhythm: Normal rate and regular rhythm.  Pulmonary:     Effort: Pulmonary effort is normal.  Skin:    Comments: Macular papular rash of bilateral anterior lower legs with some scabbed lesions.   Neurological:     General: No focal deficit present.     Mental Status: He is alert and oriented to person, place, and time.  Psychiatric:        Mood and Affect: Mood normal.        Behavior: Behavior normal.           Assessment & Plan:  Marland KitchenMarland KitchenAryn was seen today for hypertension.  Diagnoses and all orders for this visit:  Essential hypertension -     amLODipine (NORVASC) 5 MG tablet; Take 1 tablet (5 mg total) by mouth daily.  Irritant  contact dermatitis due to detergent -     triamcinolone cream (KENALOG) 0.1 %; Apply 1 application topically 2 (two) times daily.  Mixed hyperlipidemia -     pravastatin (PRAVACHOL) 20 MG tablet; Take 1 tablet (20 mg total) by mouth daily. -     Lipid Panel w/reflex Direct LDL  Urinary incontinence, unspecified type  Family history of prostate cancer   BP looks good. Refills given.   Urinary symptoms seem consistent with BPH. Medication working great. PSA did almost double in 2 month and has a family hx. I feel like urology appt is still needed.   Ordered lipid to recheck efficacy of pravachol.   Rash seems like due to irritation to detergent and/or sweat and high rise socks. Given topical steroid cream to use as needed. Avoid long socks in the spring/summer. Suggest low cut socks. Cool compress as needed.

## 2018-09-12 DIAGNOSIS — L240 Irritant contact dermatitis due to detergents: Secondary | ICD-10-CM | POA: Insufficient documentation

## 2018-09-12 HISTORY — DX: Irritant contact dermatitis due to detergents: L24.0

## 2018-09-14 DIAGNOSIS — R8271 Bacteriuria: Secondary | ICD-10-CM | POA: Diagnosis not present

## 2018-09-14 DIAGNOSIS — R3915 Urgency of urination: Secondary | ICD-10-CM | POA: Diagnosis not present

## 2018-09-14 DIAGNOSIS — R350 Frequency of micturition: Secondary | ICD-10-CM | POA: Diagnosis not present

## 2018-09-14 DIAGNOSIS — R3912 Poor urinary stream: Secondary | ICD-10-CM | POA: Diagnosis not present

## 2018-09-25 ENCOUNTER — Ambulatory Visit: Payer: BLUE CROSS/BLUE SHIELD | Admitting: Physician Assistant

## 2018-09-25 ENCOUNTER — Encounter: Payer: Self-pay | Admitting: Physician Assistant

## 2018-09-25 VITALS — BP 140/67 | HR 65 | Temp 98.5°F | Ht 72.0 in | Wt 271.0 lb

## 2018-09-25 DIAGNOSIS — T7840XA Allergy, unspecified, initial encounter: Secondary | ICD-10-CM | POA: Diagnosis not present

## 2018-09-25 DIAGNOSIS — L509 Urticaria, unspecified: Secondary | ICD-10-CM | POA: Diagnosis not present

## 2018-09-25 HISTORY — DX: Urticaria, unspecified: L50.9

## 2018-09-25 HISTORY — DX: Allergy, unspecified, initial encounter: T78.40XA

## 2018-09-25 MED ORDER — METHYLPREDNISOLONE SODIUM SUCC 125 MG IJ SOLR
125.0000 mg | Freq: Once | INTRAMUSCULAR | Status: AC
  Administered 2018-09-25: 125 mg via INTRAMUSCULAR

## 2018-09-25 MED ORDER — PREDNISONE 20 MG PO TABS: ORAL_TABLET | ORAL | 0 refills | Status: DC

## 2018-09-25 NOTE — Patient Instructions (Signed)
Angioedema  Angioedema is sudden swelling in the body. The swelling can happen in any part of the body. It often happens on the skin and causes itchy, bumpy patches (hives) to form. This condition may:  Happen only one time.  Happen more than one time. It may come back at random times.  Keep coming back for a number of years. Someday it may stop coming back. Follow these instructions at home:  Take over-the-counter and prescription medicines only as told by your doctor.  If you were given medicines for emergency allergy treatment, always carry them with you.  Wear a medical bracelet as told by your doctor.  Avoid the things that cause your attacks (triggers).  If this condition was passed to you from your parents and you want to have kids, talk to your doctor. Your kids may also have this condition. Contact a doctor if:  You have another attack.  Your attacks happen more often, even after you take steps to prevent them.  This condition was passed to you by your parents and you want to have kids. Get help right away if:  Your mouth, tongue, or lips get very swollen.  You have trouble breathing.  You have trouble swallowing.  You pass out (faint). This information is not intended to replace advice given to you by your health care provider. Make sure you discuss any questions you have with your health care provider. Document Released: 04/14/2009 Document Revised: 11/26/2015 Document Reviewed: 11/04/2015 Elsevier Interactive Patient Education  2019 Elsevier Inc.  

## 2018-09-25 NOTE — Progress Notes (Signed)
   Subjective:    Patient ID: Ernest Fuentes, male    DOB: 1966/04/01, 53 y.o.   MRN: [id number]  HPI  Pt is a 53 yo male who presents to the clinic with itchy hives on his head, neck, arms, and hands after taking ASA 81mg  on Friday. This has happened once before but he thought it was his cholesterol medication but he had also taken Asprin. He denies any ST, dysphagia, lip swelling, tongue swelling or difficultly breathing. He has taken benadryl orally and cream over the itchy rash. He comes in today because the rash is a little better but just not improving.   .. Active Ambulatory Problems    Diagnosis Date Noted  . OSA (obstructive sleep apnea) 06/21/2018  . Ganglion cyst of dorsum of right wrist 06/21/2018  . Essential hypertension 06/21/2018  . Hyperlipidemia 06/23/2018  . Current smoker 06/23/2018  . Urinary incontinence 09/11/2018  . Family history of prostate cancer 09/11/2018  . Irritant contact dermatitis due to detergent 09/12/2018  . Urticaria 09/25/2018  . Allergic drug reaction 09/25/2018   Resolved Ambulatory Problems    Diagnosis Date Noted  . No Resolved Ambulatory Problems   Past Medical History:  Diagnosis Date  . COPD (chronic obstructive pulmonary disease) (HCC)   . Hypertension      Review of Systems  All other systems reviewed and are negative.      Objective:   Physical Exam Vitals signs reviewed.  Constitutional:      Appearance: Normal appearance.  HENT:     Head: Normocephalic and atraumatic.     Mouth/Throat:     Mouth: Mucous membranes are moist.     Pharynx: Oropharynx is clear.  Eyes:     Extraocular Movements: Extraocular movements intact.     Conjunctiva/sclera: Conjunctivae normal.     Pupils: Pupils are equal, round, and reactive to light.  Cardiovascular:     Rate and Rhythm: Normal rate and regular rhythm.     Pulses: Normal pulses.  Pulmonary:     Effort: Pulmonary effort is normal.     Breath sounds: Normal breath sounds.   Skin:    Comments: Raised whelps on face, neck, arms, hands.  Neurological:     General: No focal deficit present.     Mental Status: He is alert and oriented to person, place, and time.  Psychiatric:        Mood and Affect: Mood normal.           Assessment & Plan:  Marland KitchenMarland KitchenXavian was seen today for urticaria.  Diagnoses and all orders for this visit:  Allergic reaction to drug, initial encounter -     predniSONE (DELTASONE) 20 MG tablet; Take 3 tablets for 3 days, take 2 tablets for 3 days, take 1 tablet for 3 days, take 1/2 tablet for 4 days. -     methylPREDNISolone sodium succinate (SOLU-MEDROL) 125 mg/2 mL injection 125 mg  Urticaria -     predniSONE (DELTASONE) 20 MG tablet; Take 3 tablets for 3 days, take 2 tablets for 3 days, take 1 tablet for 3 days, take 1/2 tablet for 4 days. -     methylPREDNISolone sodium succinate (SOLU-MEDROL) 125 mg/2 mL injection 125 mg   2nd time happened after ASA. Placed on allergy list. Discussed need to avoid NSAIDs as well. Use tylenol as needed for pain. Discussed reactions can get worse with problems breathing. Solumedrol given today and oral prednisone. Follow up as needed.

## 2018-11-06 ENCOUNTER — Other Ambulatory Visit: Payer: Self-pay | Admitting: Osteopathic Medicine

## 2018-11-27 ENCOUNTER — Ambulatory Visit (INDEPENDENT_AMBULATORY_CARE_PROVIDER_SITE_OTHER): Payer: BC Managed Care – PPO | Admitting: Osteopathic Medicine

## 2018-11-27 ENCOUNTER — Encounter: Payer: Self-pay | Admitting: Osteopathic Medicine

## 2018-11-27 ENCOUNTER — Ambulatory Visit: Payer: BLUE CROSS/BLUE SHIELD | Admitting: Physician Assistant

## 2018-11-27 DIAGNOSIS — J302 Other seasonal allergic rhinitis: Secondary | ICD-10-CM | POA: Diagnosis not present

## 2018-11-27 DIAGNOSIS — J019 Acute sinusitis, unspecified: Secondary | ICD-10-CM

## 2018-11-27 MED ORDER — AMOXICILLIN-POT CLAVULANATE 875-125 MG PO TABS: 1.0000 | ORAL_TABLET | Freq: Two times a day (BID) | ORAL | 0 refills | Status: AC

## 2018-11-27 MED ORDER — IPRATROPIUM BROMIDE 0.06 % NA SOLN: 2.0000 | Freq: Four times a day (QID) | NASAL | 1 refills | Status: DC

## 2018-11-27 NOTE — Progress Notes (Signed)
Virtual Visit via Video (App used: Doximity) Note  I connected with      Ernest Fuentes on 11/27/18 at 10:10 AM by a telemedicine application and verified that I am speaking with the correct person using two identifiers.  Patient is at home I am in office    I discussed the limitations of evaluation and management by telemedicine and the availability of in person appointments. The patient expressed understanding and agreed to proceed.  History of Present Illness: Ernest Fuentes is a 53 y.o. male who would like to discuss ear, sinus problem    Ear pressure and congestion started on Friday 3-4 days ago. Taking Ayr and Benadryl, which helps some. Coughing up phlegm from postnasal drip, bo dry cough or frequent cough. No fever.     Observations/Objective: There were no vitals taken for this visit. BP Readings from Last 3 Encounters:  09/25/18 140/67  09/11/18 131/72  08/15/18 (!) 165/85   Exam: Normal Speech.  NAD  Lab and Radiology Results No results found for this or any previous visit (from the past 72 hour(s)). No results found.     Assessment and Plan: 53 y.o. male with The primary encounter diagnosis was Seasonal allergies. A diagnosis of Acute non-recurrent sinusitis, unspecified location was also pertinent to this visit.  Low suspicion for COVID in patient with symptoms limited to sinuses/ears, history of seasonal allergies and congestion past week. Advised to stay home a few days to see if antibiotics help. If abx not helping, would stay home full 10 days since sick + 3 days since well and get set up for COVID test. See letter.   PDMP not reviewed this encounter. No orders of the defined types were placed in this encounter.  Meds ordered this encounter  Medications  . amoxicillin-clavulanate (AUGMENTIN) 875-125 MG tablet    Sig: Take 1 tablet by mouth 2 (two) times daily for 7 days.    Dispense:  14 tablet    Refill:  0  . ipratropium (ATROVENT) 0.06 % nasal  spray    Sig: Place 2 sprays into both nostrils 4 (four) times daily.    Dispense:  15 mL    Refill:  1   Patient Instructions  I do not have a strong suspicion for COVID.   BUT if we are being cautions, please stay home for a few days to make sure antibiotics are working.   If antibiotics are NOT helping, or if your symptoms change or get worse, please call so we can get you set up for COVID testing, and please follow precautions for self-isolation for 10 days from onset of symptoms  PLUS 3 days after resolution of cough/fever symptoms.      Follow Up Instructions: Return if symptoms worsen or fail to improve.    I discussed the assessment and treatment plan with the patient. The patient was provided an opportunity to ask questions and all were answered. The patient agreed with the plan and demonstrated an understanding of the instructions.   The patient was advised to call back or seek an in-person evaluation if any new concerns, if symptoms worsen or if the condition fails to improve as anticipated.  25 minutes of non-face-to-face time was provided during this encounter.                      Historical information moved to improve visibility of documentation.  Past Medical History:  Diagnosis Date  . COPD (  chronic obstructive pulmonary disease) (HCC)   . Hypertension    No past surgical history on file. Social History   Tobacco Use  . Smoking status: Former Games developermoker  . Smokeless tobacco: Current User  Substance Use Topics  . Alcohol use: Yes   family history includes Cancer in an other family member; Diabetes in an other family member; Hypertension in his father, mother, and another family member; Prostate cancer in his father; Stroke in his father and sister.  Medications: Current Outpatient Medications  Medication Sig Dispense Refill  . amLODipine (NORVASC) 5 MG tablet Take 1 tablet (5 mg total) by mouth daily. 90 tablet 1  . pravastatin (PRAVACHOL)  20 MG tablet Take 1 tablet (20 mg total) by mouth daily. 90 tablet 3  . amoxicillin-clavulanate (AUGMENTIN) 875-125 MG tablet Take 1 tablet by mouth 2 (two) times daily for 7 days. 14 tablet 0  . ipratropium (ATROVENT) 0.06 % nasal spray Place 2 sprays into both nostrils 4 (four) times daily. 15 mL 1   No current facility-administered medications for this visit.    Allergies  Allergen Reactions  . Atorvastatin Rash  . Asa [Aspirin] Hives    PDMP not reviewed this encounter. No orders of the defined types were placed in this encounter.  Meds ordered this encounter  Medications  . amoxicillin-clavulanate (AUGMENTIN) 875-125 MG tablet    Sig: Take 1 tablet by mouth 2 (two) times daily for 7 days.    Dispense:  14 tablet    Refill:  0  . ipratropium (ATROVENT) 0.06 % nasal spray    Sig: Place 2 sprays into both nostrils 4 (four) times daily.    Dispense:  15 mL    Refill:  1

## 2018-11-27 NOTE — Patient Instructions (Signed)
I do not have a strong suspicion for COVID.   BUT if we are being cautions, please stay home for a few days to make sure antibiotics are working.   If antibiotics are NOT helping, or if your symptoms change or get worse, please call so we can get you set up for COVID testing, and please follow precautions for self-isolation for 10 days from onset of symptoms  PLUS 3 days after resolution of cough/fever symptoms.

## 2019-04-11 DIAGNOSIS — Z20828 Contact with and (suspected) exposure to other viral communicable diseases: Secondary | ICD-10-CM | POA: Diagnosis not present

## 2019-05-13 ENCOUNTER — Other Ambulatory Visit: Payer: Self-pay | Admitting: Physician Assistant

## 2019-05-13 DIAGNOSIS — I10 Essential (primary) hypertension: Secondary | ICD-10-CM

## 2019-06-25 ENCOUNTER — Other Ambulatory Visit: Payer: Self-pay

## 2019-06-25 ENCOUNTER — Encounter: Payer: Self-pay | Admitting: Physician Assistant

## 2019-06-25 ENCOUNTER — Ambulatory Visit (INDEPENDENT_AMBULATORY_CARE_PROVIDER_SITE_OTHER): Payer: BC Managed Care – PPO | Admitting: Physician Assistant

## 2019-06-25 VITALS — BP 140/82 | HR 60 | Ht 73.0 in | Wt 275.0 lb

## 2019-06-25 DIAGNOSIS — Z0000 Encounter for general adult medical examination without abnormal findings: Secondary | ICD-10-CM

## 2019-06-25 DIAGNOSIS — E782 Mixed hyperlipidemia: Secondary | ICD-10-CM

## 2019-06-25 DIAGNOSIS — I10 Essential (primary) hypertension: Secondary | ICD-10-CM | POA: Diagnosis not present

## 2019-06-25 DIAGNOSIS — R21 Rash and other nonspecific skin eruption: Secondary | ICD-10-CM

## 2019-06-25 DIAGNOSIS — Z1211 Encounter for screening for malignant neoplasm of colon: Secondary | ICD-10-CM

## 2019-06-25 HISTORY — DX: Rash and other nonspecific skin eruption: R21

## 2019-06-25 LAB — COMPLETE METABOLIC PANEL WITH GFR
AG Ratio: 1.3 (calc) (ref 1.0–2.5)
ALT: 11 U/L (ref 9–46)
AST: 18 U/L (ref 10–35)
Albumin: 4.3 g/dL (ref 3.6–5.1)
Alkaline phosphatase (APISO): 114 U/L (ref 35–144)
BUN: 13 mg/dL (ref 7–25)
CO2: 27 mmol/L (ref 20–32)
Calcium: 9.7 mg/dL (ref 8.6–10.3)
Chloride: 106 mmol/L (ref 98–110)
Creat: 1.01 mg/dL (ref 0.70–1.33)
GFR, Est African American: 97 mL/min/{1.73_m2} (ref >=60)
GFR, Est Non African American: 83 mL/min/{1.73_m2} (ref >=60)
Globulin: 3.2 g/dL (calc) (ref 1.9–3.7)
Glucose, Bld: 102 mg/dL — ABNORMAL HIGH (ref 65–99)
Potassium: 4.2 mmol/L (ref 3.5–5.3)
Sodium: 139 mmol/L (ref 135–146)
Total Bilirubin: 0.9 mg/dL (ref 0.2–1.2)
Total Protein: 7.5 g/dL (ref 6.1–8.1)

## 2019-06-25 LAB — LIPID PANEL W/REFLEX DIRECT LDL
Cholesterol: 182 mg/dL (ref <200)
HDL: 44 mg/dL (ref >=40)
LDL Cholesterol (Calc): 120 mg/dL (calc) — ABNORMAL HIGH
Non-HDL Cholesterol (Calc): 138 mg/dL (calc) — ABNORMAL HIGH (ref <130)
Total CHOL/HDL Ratio: 4.1 (calc) (ref <5.0)
Triglycerides: 82 mg/dL (ref <150)

## 2019-06-25 MED ORDER — NYSTATIN-TRIAMCINOLONE 100000-0.1 UNIT/GM-% EX OINT: 1.0000 "application " | TOPICAL_OINTMENT | Freq: Two times a day (BID) | CUTANEOUS | 0 refills | Status: DC

## 2019-06-25 MED ORDER — AMLODIPINE BESYLATE 5 MG PO TABS: 5.0000 mg | ORAL_TABLET | Freq: Every day | ORAL | 3 refills | Status: DC

## 2019-06-25 MED ORDER — PRAVASTATIN SODIUM 20 MG PO TABS: 20.0000 mg | ORAL_TABLET | Freq: Every day | ORAL | 3 refills | Status: DC

## 2019-06-25 NOTE — Patient Instructions (Signed)

## 2019-06-25 NOTE — Progress Notes (Signed)
Subjective:    Patient ID: Ernest Fuentes, male    DOB: 03-09-1966, 54 y.o.   MRN: [id number]  HPI  Pt is a 54 yo male with HTN, HLD who presents to the clinic for CPE.   Rash noticed under right axilla. Itching but no pain. Seems to be getting a little better with no treatment. Admits to a lot of sweating recently.   .. Active Ambulatory Problems    Diagnosis Date Noted  . OSA (obstructive sleep apnea) 06/21/2018  . Ganglion cyst of dorsum of right wrist 06/21/2018  . Essential hypertension 06/21/2018  . Hyperlipidemia 06/23/2018  . Family history of prostate cancer 09/11/2018  . Irritant contact dermatitis due to detergent 09/12/2018  . Urticaria 09/25/2018  . Allergic drug reaction 09/25/2018  . Rash 06/25/2019   Resolved Ambulatory Problems    Diagnosis Date Noted  . Current smoker 06/23/2018  . Urinary incontinence 09/11/2018   Past Medical History:  Diagnosis Date  . COPD (chronic obstructive pulmonary disease) ([place])   . Hypertension    .Ernest Fuentes Family History  Problem Relation Age of Onset  . Hypertension Other   . Diabetes Other   . Cancer Other   . Hypertension Mother   . Stroke Father   . Hypertension Father   . Prostate cancer Father   . Stroke Sister    .Ernest Fuentes Social History   Socioeconomic History  . Marital status: Single    Spouse name: Not on file  . Number of children: Not on file  . Years of education: Not on file  . Highest education level: Not on file  Occupational History  . Not on file  Tobacco Use  . Smoking status: Former Research scientist (life sciences)  . Smokeless tobacco: Current User  Substance and Sexual Activity  . Alcohol use: Yes  . Drug use: Never  . Sexual activity: Yes  Other Topics Concern  . Not on file  Social History Narrative  . Not on file   Social Determinants of Health   Financial Resource Strain:   . Difficulty of Paying Living Expenses: Not on file  Food Insecurity:   . Worried About Charity fundraiser in the Last Year: Not on file   . Ran Out of Food in the Last Year: Not on file  Transportation Needs:   . Lack of Transportation (Medical): Not on file  . Lack of Transportation (Non-Medical): Not on file  Physical Activity:   . Days of Exercise per Week: Not on file  . Minutes of Exercise per Session: Not on file  Stress:   . Feeling of Stress : Not on file  Social Connections:   . Frequency of Communication with Friends and Family: Not on file  . Frequency of Social Gatherings with Friends and Family: Not on file  . Attends Religious Services: Not on file  . Active Member of Clubs or Organizations: Not on file  . Attends Archivist Meetings: Not on file  . Marital Status: Not on file  Intimate Partner Violence:   . Fear of Current or Ex-Partner: Not on file  . Emotionally Abused: Not on file  . Physically Abused: Not on file  . Sexually Abused: Not on file      Review of Systems  All other systems reviewed and are negative.      Objective:   Physical Exam BP 140/82   Pulse 60   Ht 6\' 1"  (1.854 m)   Wt 275 lb (124.7 kg)  SpO2 100%   BMI 36.28 kg/m   General Appearance:    Alert, cooperative, no distress, appears stated age  Head:    Normocephalic, without obvious abnormality, atraumatic  Eyes:    PERRL, conjunctiva/corneas clear, EOM's intact, fundi    benign, both eyes       Ears:    Normal TM's and external ear canals, both ears  Nose:   Nares normal, septum midline, mucosa normal, no drainage    or sinus tenderness  Throat:   Lips, mucosa, and tongue normal; teeth and gums normal  Neck:   Supple, symmetrical, trachea midline, no adenopathy;       thyroid:  No enlargement/tenderness/nodules; no carotid   bruit or JVD  Back:     Symmetric, no curvature, ROM normal, no CVA tenderness  Lungs:     Clear to auscultation bilaterally, respirations unlabored  Chest wall:    No tenderness or deformity  Heart:    Regular rate and rhythm, S1 and S2 normal, no murmur, rub   or gallop   Abdomen:     Soft, non-tender, bowel sounds active all four quadrants,    no masses, no organomegaly        Extremities:   Extremities normal, atraumatic, no cyanosis or edema  Pulses:   2+ and symmetric all extremities  Skin:   Skin color, texture, turgor normal, no rashes or lesions  Lymph nodes:   Cervical, supraclavicular, and axillary nodes normal  Neurologic:   CNII-XII intact. Normal strength, sensation and reflexes      throughout         Assessment & Plan:  Ernest KitchenMarland KitchenBairon was seen today for annual exam.  Diagnoses and all orders for this visit:  Routine physical examination -     Lipid Panel w/reflex Direct LDL -     COMPLETE METABOLIC PANEL WITH GFR  Essential hypertension -     COMPLETE METABOLIC PANEL WITH GFR -     amLODipine (NORVASC) 5 MG tablet; Take 1 tablet (5 mg total) by mouth daily.  Mixed hyperlipidemia -     Lipid Panel w/reflex Direct LDL -     pravastatin (PRAVACHOL) 20 MG tablet; Take 1 tablet (20 mg total) by mouth daily.  Colon cancer screening -     Ambulatory referral to Gastroenterology  Rash -     nystatin-triamcinolone ointment (MYCOLOG); Apply 1 application topically 2 (two) times daily.  .. Depression screen [facility] 2/9 06/25/2019 11/27/2018 06/21/2018  Decreased Interest 0 0 2  Down, Depressed, Hopeless 0 0 0  PHQ - 2 Score 0 0 2  Altered sleeping 0 - 0  Tired, decreased energy 0 - 0  Change in appetite 0 - 0  Feeling bad or failure about yourself  0 - 0  Trouble concentrating 0 - 0  Moving slowly or fidgety/restless 0 - 0  Suicidal thoughts 0 - 0  PHQ-9 Score 0 - 2  Difficult doing work/chores Not difficult at all - Not difficult at all    .Ernest KitchenIPSS Questionnaire (AUA-7): Over the past month.   1)  How often have you had a sensation of not emptying your bladder completely after you finish urinating?  0 - Not at all  2)  How often have you had to urinate again less than two hours after you finished urinating? 0 - Not at all  3)  How often  have you found you stopped and started again several times when you urinated?  0 - Not at  all  4) How difficult have you found it to postpone urination?  1 - Less than 1 time in 5  5) How often have you had a weak urinary stream?  0 - Not at all  6) How often have you had to push or strain to begin urination?  0 - Not at all  7) How many times did you most typically get up to urinate from the time you went to bed until the time you got up in the morning?  1 - 1 time  Total score:  0-7 mildly symptomatic   8-19 moderately symptomatic   20-35 severely symptomatic    .Ernest KitchenStart a regular exercise program and make sure you are eating a healthy diet Try to eat 4 servings of dairy a day or take a calcium supplement (500mg  twice a day). Needs 2nd dose of shingrix but scheduled for covid vaccine this week. Get that first and then follow up with shingrx.  Ordered colonoscopy.  AUA- low score.  Fasting labs ordered.  BP to goal. Medications refilled.   Rash appears like yeast from moisture trapping. Given nystatin/triamcinolone. Discussed prevention.

## 2019-06-26 NOTE — Progress Notes (Signed)
Call pt:   Cholesterol has improved a lot from 1 year ago. LDL down from 162 to 120. Optimal is under 100. If you have been taking pravachol daily with no problems we could increase to 40mg  to try to get you to optimal goal. If you want to double up on what you have and if you tolerate let me know and then will send 40mg  tablets over.  Kidney, liver look great.  Glucose up just a hair. Continue to make good food choices and stay active. Limits carbs and sugars.

## 2019-07-06 ENCOUNTER — Other Ambulatory Visit: Payer: Self-pay | Admitting: Neurology

## 2019-07-06 MED ORDER — PRAVASTATIN SODIUM 40 MG PO TABS: 40.0000 mg | ORAL_TABLET | Freq: Every day | ORAL | 3 refills | Status: DC

## 2019-07-20 DIAGNOSIS — Z1211 Encounter for screening for malignant neoplasm of colon: Secondary | ICD-10-CM | POA: Diagnosis not present

## 2019-07-20 DIAGNOSIS — D122 Benign neoplasm of ascending colon: Secondary | ICD-10-CM | POA: Diagnosis not present

## 2019-07-20 DIAGNOSIS — D125 Benign neoplasm of sigmoid colon: Secondary | ICD-10-CM | POA: Diagnosis not present

## 2019-07-20 DIAGNOSIS — K648 Other hemorrhoids: Secondary | ICD-10-CM | POA: Diagnosis not present

## 2019-07-20 DIAGNOSIS — K5730 Diverticulosis of large intestine without perforation or abscess without bleeding: Secondary | ICD-10-CM | POA: Diagnosis not present

## 2019-07-20 DIAGNOSIS — D124 Benign neoplasm of descending colon: Secondary | ICD-10-CM | POA: Diagnosis not present

## 2019-07-20 DIAGNOSIS — K635 Polyp of colon: Secondary | ICD-10-CM | POA: Diagnosis not present

## 2019-07-20 LAB — HM COLONOSCOPY

## 2019-07-25 ENCOUNTER — Encounter: Payer: Self-pay | Admitting: Physician Assistant

## 2019-07-25 ENCOUNTER — Other Ambulatory Visit: Payer: Self-pay

## 2019-07-25 ENCOUNTER — Ambulatory Visit (INDEPENDENT_AMBULATORY_CARE_PROVIDER_SITE_OTHER): Payer: BC Managed Care – PPO | Admitting: Physician Assistant

## 2019-07-25 VITALS — BP 138/54 | HR 56 | Ht 73.0 in | Wt 270.0 lb

## 2019-07-25 DIAGNOSIS — K635 Polyp of colon: Secondary | ICD-10-CM | POA: Insufficient documentation

## 2019-07-25 DIAGNOSIS — R001 Bradycardia, unspecified: Secondary | ICD-10-CM

## 2019-07-25 DIAGNOSIS — I480 Paroxysmal atrial fibrillation: Secondary | ICD-10-CM | POA: Diagnosis not present

## 2019-07-25 DIAGNOSIS — R002 Palpitations: Secondary | ICD-10-CM | POA: Diagnosis not present

## 2019-07-25 HISTORY — DX: Bradycardia, unspecified: R00.1

## 2019-07-25 HISTORY — DX: Paroxysmal atrial fibrillation: I48.0

## 2019-07-25 HISTORY — DX: Polyp of colon: K63.5

## 2019-07-25 HISTORY — DX: Palpitations: R00.2

## 2019-07-25 MED ORDER — APIXABAN 5 MG PO TABS: 5.0000 mg | ORAL_TABLET | Freq: Two times a day (BID) | ORAL | 5 refills | Status: DC

## 2019-07-25 NOTE — Progress Notes (Signed)
   Subjective:    Patient ID: Ernest Fuentes, male    DOB: 11-27-1965, 54 y.o.   MRN: [id number]  HPI Pt is a 54 yo male with HTN who presents to the clinic to discuss episode of PAF during colonoscopy.   He was having colonoscopy and went into Afib and quickly converted into normal rhythm. He denies any CP, headaches. He does report that he feels palpitations all the time and worse when he is really active. He comes to clinic to find out next steps.   .. Active Ambulatory Problems    Diagnosis Date Noted  . OSA (obstructive sleep apnea) 06/21/2018  . Ganglion cyst of dorsum of right wrist 06/21/2018  . Essential hypertension 06/21/2018  . Hyperlipidemia 06/23/2018  . Family history of prostate cancer 09/11/2018  . Irritant contact dermatitis due to detergent 09/12/2018  . Urticaria 09/25/2018  . Allergic drug reaction 09/25/2018  . Rash 06/25/2019  . PAF (paroxysmal atrial fibrillation) (HCC) 07/25/2019  . Bradycardia 07/25/2019  . Palpitation 07/25/2019   Resolved Ambulatory Problems    Diagnosis Date Noted  . Current smoker 06/23/2018  . Urinary incontinence 09/11/2018   Past Medical History:  Diagnosis Date  . COPD (chronic obstructive pulmonary disease) (HCC)   . Hypertension       Review of Systems See HPI.     Objective:   Physical Exam Vitals reviewed.  Constitutional:      Appearance: Normal appearance. He is obese.  Cardiovascular:     Rate and Rhythm: Normal rate and regular rhythm.     Pulses: Normal pulses.  Pulmonary:     Effort: Pulmonary effort is normal.     Breath sounds: Normal breath sounds.  Neurological:     Mental Status: He is alert and oriented to person, place, and time.  Psychiatric:        Mood and Affect: Mood normal.           Assessment & Plan:  Marland KitchenMarland KitchenFindley was seen today for irregular heart beat.  Diagnoses and all orders for this visit:  PAF (paroxysmal atrial fibrillation) (HCC) -     apixaban (ELIQUIS) 5 MG TABS tablet;  Take 1 tablet (5 mg total) by mouth 2 (two) times daily. -     LONG TERM MONITOR (3-14 DAYS)  Palpitation  Bradycardia   Unclear if this has been happening periodically and not diagnosed since patient does report palpitations. I would like to get patient set up with zio patch and cardiology consult. Discussed risk of Atrial fibrillation and HO given. Pt cannot tolerate ASA due to HIVES. Started on eliquis bid. Avoided rate controlling medications due to bradycardia. Discussed if feeling like heart is not beating right for extended time frame go to UC/ED/office.   Spent 30 minutes with patient.

## 2019-07-25 NOTE — Patient Instructions (Signed)
Will get zio patch ordered.  Start eliquis twice a day.    Atrial Fibrillation  Atrial fibrillation is a type of heartbeat that is irregular or fast. If you have this condition, your heart beats without any order. This makes it hard for your heart to pump blood in a normal way. Atrial fibrillation may come and go, or it may become a long-lasting problem. If this condition is not treated, it can put you at higher risk for stroke, heart failure, and other heart problems. What are the causes? This condition may be caused by diseases that damage the heart. They include:  High blood pressure.  Heart failure.  Heart valve disease.  Heart surgery. Other causes include:  Diabetes.  Thyroid disease.  Being overweight.  Kidney disease. Sometimes the cause is not known. What increases the risk? You are more likely to develop this condition if:  You are older.  You smoke.  You exercise often and very hard.  You have a family history of this condition.  You are a man.  You use drugs.  You drink a lot of alcohol.  You have lung conditions, such as emphysema, pneumonia, or COPD.  You have sleep apnea. What are the signs or symptoms? Common symptoms of this condition include:  A feeling that your heart is beating very fast.  Chest pain or discomfort.  Feeling short of breath.  Suddenly feeling light-headed or weak.  Getting tired easily during activity.  Fainting.  Sweating. In some cases, there are no symptoms. How is this treated? Treatment for this condition depends on underlying conditions and how you feel when you have atrial fibrillation. They include:  Medicines to: ? Prevent blood clots. ? Treat heart rate or heart rhythm problems.  Using devices, such as a pacemaker, to correct heart rhythm problems.  Doing surgery to remove the part of the heart that sends bad signals.  Closing an area where clots can form in the heart (left atrial appendage). In  some cases, your doctor will treat other underlying conditions. Follow these instructions at home: Medicines  Take over-the-counter and prescription medicines only as told by your doctor.  Do not take any new medicines without first talking to your doctor.  If you are taking blood thinners: ? Talk with your doctor before you take any medicines that have aspirin or NSAIDs, such as ibuprofen, in them. ? Take your medicine exactly as told by your doctor. Take it at the same time each day. ? Avoid activities that could hurt or bruise you. Follow instructions about how to prevent falls. ? Wear a bracelet that says you are taking blood thinners. Or, carry a card that lists what medicines you take. Lifestyle      Do not use any products that have nicotine or tobacco in them. These include cigarettes, e-cigarettes, and chewing tobacco. If you need help quitting, ask your doctor.  Eat heart-healthy foods. Talk with your doctor about the right eating plan for you.  Exercise regularly as told by your doctor.  Do not drink alcohol.  Lose weight if you are overweight.  Do not use drugs, including cannabis. General instructions  If you have a condition that causes breathing to stop for a short period of time (apnea), treat it as told by your doctor.  Keep a healthy weight. Do not use diet pills unless your doctor says they are safe for you. Diet pills may make heart problems worse.  Keep all follow-up visits as told by  your doctor. This is important. Contact a doctor if:  You notice a change in the speed, rhythm, or strength of your heartbeat.  You are taking a blood-thinning medicine and you get more bruising.  You get tired more easily when you move or exercise.  You have a sudden change in weight. Get help right away if:   You have pain in your chest or your belly (abdomen).  You have trouble breathing.  You have side effects of blood thinners, such as blood in your vomit,  poop (stool), or pee (urine), or bleeding that cannot stop.  You have any signs of a stroke. "BE FAST" is an easy way to remember the main warning signs: ? B - Balance. Signs are dizziness, sudden trouble walking, or loss of balance. ? E - Eyes. Signs are trouble seeing or a change in how you see. ? F - Face. Signs are sudden weakness or loss of feeling in the face, or the face or eyelid drooping on one side. ? A - Arms. Signs are weakness or loss of feeling in an arm. This happens suddenly and usually on one side of the body. ? S - Speech. Signs are sudden trouble speaking, slurred speech, or trouble understanding what people say. ? T - Time. Time to call emergency services. Write down what time symptoms started.  You have other signs of a stroke, such as: ? A sudden, very bad headache with no known cause. ? Feeling like you may vomit (nausea). ? Vomiting. ? A seizure. These symptoms may be an emergency. Do not wait to see if the symptoms will go away. Get medical help right away. Call your local emergency services (911 in the [place] Do not drive yourself to the hospital. Summary  Atrial fibrillation is a type of heartbeat that is irregular or fast.  You are at higher risk of this condition if you smoke, are older, have diabetes, or are overweight.  Follow your doctor's instructions about medicines, diet, exercise, and follow-up visits.  Get help right away if you have signs or symptoms of a stroke.  Get help right away if you cannot catch your breath, or you have chest pain or discomfort. This information is not intended to replace advice given to you by your health care provider. Make sure you discuss any questions you have with your health care provider. Document Revised: 10/18/2018 Document Reviewed: 10/18/2018 Elsevier Patient Education  2020 [employer].

## 2019-07-26 ENCOUNTER — Encounter: Payer: Self-pay | Admitting: Physician Assistant

## 2019-07-30 ENCOUNTER — Telehealth: Payer: Self-pay | Admitting: Neurology

## 2019-07-30 NOTE — Telephone Encounter (Signed)
Spoke with patient. He states pharmacy told him $100 per month with the savings card. Please advise.

## 2019-07-30 NOTE — Telephone Encounter (Signed)
Patient's fiance called for him stating have not heard from holter monitor and Eliquis is $100 a month with insurance - too expensive. He did get first month free with card.   I called back and LMOM (as instructed) that referral has been placed for monitor and to let us know if haven't heard by the end of the week.  Also, two cards were given, one for first month free, other for ongoing RX for Eliquis. Instructed to try other card next month and let us know if it doesn't work.   He is to call back with any questions.

## 2019-07-31 MED ORDER — RIVAROXABAN 20 MG PO TABS: 20.0000 mg | ORAL_TABLET | Freq: Every day | ORAL | 5 refills | Status: DC

## 2019-07-31 NOTE — Addendum Note (Signed)
Addended by: Jomarie Longs on: 07/31/2019 04:31 PM   Modules accepted: Orders

## 2019-07-31 NOTE — Telephone Encounter (Signed)
Left message on machine for patient making him aware and to call back if he has any questions.

## 2019-07-31 NOTE — Telephone Encounter (Signed)
Sent xaralto once daily to pharmacy.

## 2019-08-01 ENCOUNTER — Encounter: Payer: Self-pay | Admitting: Physician Assistant

## 2019-08-03 ENCOUNTER — Telehealth: Payer: Self-pay | Admitting: Neurology

## 2019-08-03 NOTE — Telephone Encounter (Signed)
Received message from patient's fiance that they haven't heard about scheduling heart monitor. It looks like referral sent 07/25/2019. I called back and gave patient the number to Cardiology to call them directly [phone].

## 2019-08-13 ENCOUNTER — Telehealth: Payer: Self-pay | Admitting: *Deleted

## 2019-08-13 NOTE — Telephone Encounter (Signed)
Wife of the patient called again. She said she has been waiting for 3 weeks now and would like to know why this is taking so long. Please advise

## 2019-08-13 NOTE — Telephone Encounter (Signed)
New Message  Ernest Fuentes is calling in about the status of patient's monitor. Please give a call back to discuss.

## 2019-08-14 NOTE — Telephone Encounter (Signed)
Spoke with pt and apologized for the delay in getting his monitor taken care of. Let pt know he could go to the office on [address] and have monitor put on today by Katie at 10, 12 or 2. He was driving and unable to write down address but I said we could hang up and I could leave a msg on his phone with the address and times he could go. Pt was agreeable to this plan. Asked Katie to let me know if he comes or not so I will know whether I need to have monitor mailed to pt.

## 2019-08-17 ENCOUNTER — Ambulatory Visit (INDEPENDENT_AMBULATORY_CARE_PROVIDER_SITE_OTHER): Payer: BC Managed Care – PPO

## 2019-08-17 DIAGNOSIS — I480 Paroxysmal atrial fibrillation: Secondary | ICD-10-CM | POA: Diagnosis not present

## 2019-08-27 ENCOUNTER — Telehealth: Payer: Self-pay | Admitting: Neurology

## 2019-08-27 NOTE — Telephone Encounter (Signed)
Called patient. He already saw Cardiology, wearing monitor now. He will contact them about medication options.

## 2019-08-27 NOTE — Telephone Encounter (Signed)
Patient/fiance called and states Xarelto still expensive. Pharmacy told them Plavix would be the most affordable option for him. Please advise.

## 2019-08-27 NOTE — Telephone Encounter (Signed)
When he sees cardiology discuss options. Plavix is not an anticoagulant. It is an anti-platelet.

## 2019-08-29 ENCOUNTER — Encounter: Payer: Self-pay | Admitting: Physician Assistant

## 2019-09-11 ENCOUNTER — Telehealth: Payer: Self-pay | Admitting: *Deleted

## 2019-09-11 NOTE — Telephone Encounter (Signed)
[employer] called reference # [id number].  Requesting enrollment for a monitor received on Ernest Fuentes. Monitor was started on 08/17/2019.  Monitor serial # is [id number] and was distributed by [facility] office from their office inventory.  Enrollment will be sent to [employer] listing [facility] as the enrolling facility.

## 2019-09-12 NOTE — Telephone Encounter (Signed)
Patient's wife states she is calling to discuss the next step for the patient, following returning the monitor. Please call.

## 2019-09-16 DIAGNOSIS — I480 Paroxysmal atrial fibrillation: Secondary | ICD-10-CM | POA: Diagnosis not present

## 2019-09-26 NOTE — Telephone Encounter (Signed)
Follow Up  Patient is returning a missed call. States it may have been for monitor results. Please give a call back.

## 2019-09-26 NOTE — Telephone Encounter (Signed)
Called back but the call goes to a full VM.

## 2019-11-09 ENCOUNTER — Telehealth: Payer: Self-pay | Admitting: Neurology

## 2019-11-09 ENCOUNTER — Other Ambulatory Visit: Payer: Self-pay | Admitting: Physician Assistant

## 2019-11-09 DIAGNOSIS — I480 Paroxysmal atrial fibrillation: Secondary | ICD-10-CM

## 2019-11-09 MED ORDER — ATENOLOL 25 MG PO TABS
25.0000 mg | ORAL_TABLET | Freq: Every day | ORAL | 0 refills | Status: DC
Start: 2019-11-09 — End: 2020-02-04

## 2019-11-09 NOTE — Telephone Encounter (Signed)
Spoke with patient and cardiology.

## 2019-11-09 NOTE — Progress Notes (Signed)
Cardiology calls office because patient wanted to be seen at cardiology office today and they need referral.  Monitor results came in 09/17/2019. Pt has not been alerted. Per pt he has called cardiology office and they have not gotten back with them. Apparently we did not see results or contact from this office. Viewed results and evidence of PAF. Explained this to patient. He is already on anti-coagulation and norvasc. Will add beta blocker 25mg  atenolol. Will refer to cardiology.

## 2019-11-09 NOTE — Telephone Encounter (Signed)
[facility] called and spoke with Ernest Fuentes, they state patient's long term heart monitor was abnormal. It looks like read on 09/17/2019. I told Ernest Fuentes to let them know he needs a follow up appt with them. Last phone note states patient saw Cardiology, but I don't see any visit history. Did you see results of monitor?

## 2019-11-09 NOTE — Telephone Encounter (Signed)
Pt arrived to the [facility] office requesting results of monitor. Results were reviewed by Dr. Tomie China with pt's PA as we have never saw the pt. Tandy Gaw is aware of the results and will call the pt with same.

## 2019-12-07 ENCOUNTER — Other Ambulatory Visit: Payer: Self-pay

## 2019-12-07 ENCOUNTER — Encounter: Payer: Self-pay | Admitting: Cardiology

## 2019-12-07 ENCOUNTER — Ambulatory Visit (INDEPENDENT_AMBULATORY_CARE_PROVIDER_SITE_OTHER): Payer: BC Managed Care – PPO | Admitting: Cardiology

## 2019-12-07 ENCOUNTER — Other Ambulatory Visit: Payer: Self-pay | Admitting: *Deleted

## 2019-12-07 VITALS — BP 136/74 | HR 51 | Ht 72.0 in | Wt 265.1 lb

## 2019-12-07 DIAGNOSIS — E782 Mixed hyperlipidemia: Secondary | ICD-10-CM | POA: Diagnosis not present

## 2019-12-07 DIAGNOSIS — G4733 Obstructive sleep apnea (adult) (pediatric): Secondary | ICD-10-CM

## 2019-12-07 DIAGNOSIS — I480 Paroxysmal atrial fibrillation: Secondary | ICD-10-CM

## 2019-12-07 DIAGNOSIS — R011 Cardiac murmur, unspecified: Secondary | ICD-10-CM

## 2019-12-07 DIAGNOSIS — I200 Unstable angina: Secondary | ICD-10-CM

## 2019-12-07 DIAGNOSIS — I10 Essential (primary) hypertension: Secondary | ICD-10-CM | POA: Diagnosis not present

## 2019-12-07 HISTORY — DX: Cardiac murmur, unspecified: R01.1

## 2019-12-07 NOTE — Patient Instructions (Addendum)
Medication Instructions:  Your physician recommends that you continue on your current medications as directed. Please refer to the Current Medication list given to you today.  *If you need a refill on your cardiac medications before your next appointment, please call your pharmacy*   Lab Work: You will need to come to the office to have a BMET once your CT has been scheduled.  They will tell you when to come for your lab work once they call and give you a date for the CT.  If you have labs (blood work) drawn today and your tests are completely normal, you will receive your results only by: [name] MyChart Message (if you have MyChart) OR . A paper copy in the mail If you have any lab test that is abnormal or we need to change your treatment, we will call you to review the results.   Testing/Procedures: Your physician has requested that you have an echocardiogram. Echocardiography is a painless test that uses sound waves to create images of your heart. It provides your doctor with information about the size and shape of your heart and how well your heart's chambers and valves are working. This procedure takes approximately one hour. There are no restrictions for this procedure.   Your physician has requested that you have cardiac CT. ONCE THEY GET AN AUTHORIZATION FROM YOUR INSURANCE COMPANY, THEY WILL CALL YOU TO SCHEDULE A DATE.  Cardiac computed tomography (CT) is a painless test that uses an x-ray machine to take clear, detailed pictures of your heart. For further information please visit [email or web address]. Please follow instruction sheet BELOW:  Your cardiac CT will be scheduled at the below location:   [facility] [address] [place], [place] [address] [phone]   At [facility], please arrive at the [facility] main entrance of [facility] 30 minutes prior to test start time. Proceed to the [facility] Radiology Department (first floor) to check-in  and test prep.  Please follow these instructions carefully (unless otherwise directed):  Hold all erectile dysfunction medications at least 3 days (72 hrs) prior to test.  On the Night Before the Test: . Be sure to Drink plenty of water. . Do not consume any caffeinated/decaffeinated beverages or chocolate 12 hours prior to your test. . Do not take any antihistamines 12 hours prior to your test.   On the Day of the Test: . Drink plenty of water. Do not drink any water within one hour of the test. . Do not eat any food 4 hours prior to the test. . You may take your regular medications prior to the test.  . PER DR REVANKAR, NO LOPRESSOR NEEDED SINCE HR WAS 51 IN CLINIC . PT AWARE THEY WILL LET HIM KNOW WHEN TO COME TO GET THE BMET ONCE IT HAS BEEN SCHEDULED        After the Test: . Drink plenty of water. . After receiving IV contrast, you may experience a mild flushed feeling. This is normal. . On occasion, you may experience a mild rash up to 24 hours after the test. This is not dangerous. If this occurs, you can take Benadryl 25 mg and increase your fluid intake. . If you experience trouble breathing, this can be serious. If it is severe call 911 IMMEDIATELY. If it is mild, please call our office.    Once we have confirmed authorization from your insurance company, we will call you to set up a date and time for  your test. Based on how quickly your insurance processes prior authorizations requests, please allow up to 4 weeks to be contacted for scheduling your Cardiac CT appointment. Be advised that routine Cardiac CT appointments could be scheduled as many as 8 weeks after your provider has ordered it.  For non-scheduling related questions, please contact the cardiac imaging nurse navigator should you have any questions/concerns: Marchia Bond, Cardiac Imaging Nurse Navigator Burley Saver, Interim Cardiac Imaging Nurse [facility] Direct Office Dial:  [phone]   For scheduling needs, including cancellations and rescheduling, please call Vivien Rota at [phone], option [phone].         Follow-Up: At [facility], you and your health needs are our priority.  As part of our continuing mission to provide you with exceptional heart care, we have created designated Provider Care Teams.  These Care Teams include your primary Cardiologist (physician) and Advanced Practice Providers (APPs -  Physician Assistants and Nurse Practitioners) who all work together to provide you with the care you need, when you need it.  We recommend signing up for the patient portal called "MyChart".  Sign up information is provided on this After Visit Summary.  MyChart is used to connect with patients for Virtual Visits (Telemedicine).  Patients are able to view lab/test results, encounter notes, upcoming appointments, etc.  Non-urgent messages can be sent to your provider as well.   To learn more about what you can do with MyChart, go to [email or web address].    Your next appointment:   01/23/2020 ARRIVE AT 11:00 FOR REGISTRATION  The format for your next appointment:   In Person  Provider:   Jyl Heinz, MD   Other Instructions  Echocardiogram An echocardiogram is a procedure that uses painless sound waves (ultrasound) to produce an image of the heart. Images from an echocardiogram can provide important information about:  Signs of coronary artery disease (CAD).  Aneurysm detection. An aneurysm is a weak or damaged part of an artery wall that bulges out from the normal force of blood pumping through the body.  Heart size and shape. Changes in the size or shape of the heart can be associated with certain conditions, including heart failure, aneurysm, and CAD.  Heart muscle function.  Heart valve function.  Signs of a past heart attack.  Fluid buildup around the heart.  Thickening of the heart muscle.  A tumor or infectious growth around the  heart valves. Tell a health care provider about:  Any allergies you have.  All medicines you are taking, including vitamins, herbs, eye drops, creams, and over-the-counter medicines.  Any blood disorders you have.  Any surgeries you have had.  Any medical conditions you have.  Whether you are pregnant or may be pregnant. What are the risks? Generally, this is a safe procedure. However, problems may occur, including:  Allergic reaction to dye (contrast) that may be used during the procedure. What happens before the procedure? No specific preparation is needed. You may eat and drink normally. What happens during the procedure?   An IV tube may be inserted into one of your veins.  You may receive contrast through this tube. A contrast is an injection that improves the quality of the pictures from your heart.  A gel will be applied to your chest.  A wand-like tool (transducer) will be moved over your chest. The gel will help to transmit the sound waves from the transducer.  The sound waves will harmlessly bounce off of your  heart to allow the heart images to be captured in real-time motion. The images will be recorded on a computer. The procedure may vary among health care providers and hospitals. What happens after the procedure?  You may return to your normal, everyday life, including diet, activities, and medicines, unless your health care provider tells you not to do that. Summary  An echocardiogram is a procedure that uses painless sound waves (ultrasound) to produce an image of the heart.  Images from an echocardiogram can provide important information about the size and shape of your heart, heart muscle function, heart valve function, and fluid buildup around your heart.  You do not need to do anything to prepare before this procedure. You may eat and drink normally.  After the echocardiogram is completed, you may return to your normal, everyday life, unless your health  care provider tells you not to do that. This information is not intended to replace advice given to you by your health care provider. Make sure you discuss any questions you have with your health care provider. Document Revised: 08/17/2018 Document Reviewed: 05/29/2016 Elsevier Patient Education  [place].

## 2019-12-07 NOTE — Progress Notes (Signed)
Cardiology Office Note:    Date:  12/07/2019   ID:  Ernest Fuentes, DOB 05/19/1965, MRN [id number]  PCP:  Jomarie Longs, PA-C  Cardiologist:  Garwin Brothers, MD   Referring MD: Jomarie Longs, PA-C    ASSESSMENT:    1. PAF (paroxysmal atrial fibrillation) (HCC)   2. Essential hypertension   3. Mixed hyperlipidemia   4. OSA (obstructive sleep apnea)   5. Unstable angina pectoris (HCC)   6. Cardiac murmur    PLAN:    In order of problems listed above:  1. Paroxysmal atrial fibrillation:I discussed with the patient atrial fibrillation, disease process. Management and therapy including rate and rhythm control, anticoagulation benefits and potential risks were discussed extensively with the patient. Patient had multiple questions which were answered to patient's satisfaction.  Currently I will continue his anticoagulation. 2. Angina pectoris: He is patient with multiple risk factors for coronary artery disease and will do a CT coronary angiography with FFR.  This will also help me pursue antiarrhythmic therapy with flecainide. 3. Essential hypertension: Blood pressure is stable and diet was emphasized 4. Mixed dyslipidemia: Lipids followed by primary care physician. 5. Obstructive sleep apnea: He has diagnosis of this but does not use any therapy for it.  I told him that this could be difficult in treating his atrial fibrillation and he will pursue it and get appropriate therapy for sleep apnea. 6. Overweight: Weight reduction was stressed and emphasized this to him extensively and he promises to do better. 7. Cardiac murmur: Echocardiogram will be done to assess murmur heard on auscultation. 8. At this point I will continue his anticoagulation.  I will see for any vascular disease which may increase his CHA2DS2-VASc score.  Keep patient follow-up appointment in 6 weeks or earlier if he has any concerns.  He had multiple questions which were answered to her  satisfaction.   Medication Adjustments/Labs and Tests Ordered: Current medicines are reviewed at length with the patient today.  Concerns regarding medicines are outlined above.  Orders Placed This Encounter  Procedures  . CT CORONARY MORPH W/CTA COR W/SCORE W/CA W/CM &/OR WO/CM  . CT CORONARY FRACTIONAL FLOW RESERVE DATA PREP  . CT CORONARY FRACTIONAL FLOW RESERVE FLUID ANALYSIS  . Basic metabolic panel  . EKG 12-Lead  . ECHOCARDIOGRAM COMPLETE   No orders of the defined types were placed in this encounter.    History of Present Illness:    Ernest Fuentes is a 54 y.o. male who is being seen today for the evaluation of paroxysmal atrial fibrillation at the request of Jomarie Longs, PA-C.  Patient is a pleasant 54 year old male.  He has past medical history of essential hypertension dyslipidemia obstructive sleep apnea and is overweight.  He mentions to me that he was diagnosed approximately fibrillation.  He also says that at young age he was told to have irregular heartbeat.  He does not have a diagnosis of this.  During his colonoscopy he had irregular heartbeat and atrial fibrillation was detected and confirmed on event monitoring.  At the time of my evaluation, the patient is alert awake oriented and in no distress.  He occasionally has chest tightness not related to exertion.  No orthopnea or PND.  Past Medical History:  Diagnosis Date  . COPD (chronic obstructive pulmonary disease) (HCC)   . Hypertension     History reviewed. No pertinent surgical history.  Current Medications: Current Meds  Medication Sig  . amLODipine (NORVASC) 5 MG  tablet Take 1 tablet (5 mg total) by mouth daily.  [name] apixaban (ELIQUIS) 5 MG TABS tablet Take 1 tablet (5 mg total) by mouth 2 (two) times daily.  [name] atenolol (TENORMIN) 25 MG tablet Take 1 tablet (25 mg total) by mouth daily.  . pravastatin (PRAVACHOL) 20 MG tablet Take 20 mg by mouth daily.     Allergies:   Atorvastatin and Asa [aspirin]    Social History   Socioeconomic History  . Marital status: Single    Spouse name: Not on file  . Number of children: Not on file  . Years of education: Not on file  . Highest education level: Not on file  Occupational History  . Not on file  Tobacco Use  . Smoking status: Former Games developer  . Smokeless tobacco: Current User  Vaping Use  . Vaping Use: Never used  Substance and Sexual Activity  . Alcohol use: Yes  . Drug use: Never  . Sexual activity: Yes  Other Topics Concern  . Not on file  Social History Narrative  . Not on file   Social Determinants of Health   Financial Resource Strain:   . Difficulty of Paying Living Expenses:   Food Insecurity:   . Worried About Programme researcher, broadcasting/film/video in the Last Year:   . Barista in the Last Year:   Transportation Needs:   . Freight forwarder (Medical):   [name] Lack of Transportation (Non-Medical):   Physical Activity:   . Days of Exercise per Week:   . Minutes of Exercise per Session:   Stress:   . Feeling of Stress :   Social Connections:   . Frequency of Communication with Friends and Family:   . Frequency of Social Gatherings with Friends and Family:   . Attends Religious Services:   . Active Member of Clubs or Organizations:   . Attends Banker Meetings:   [name] Marital Status:      Family History: The patient's family history includes Cancer in an other family member; Diabetes in an other family member; Hypertension in his father, mother, and another family member; Prostate cancer in his father; Stroke in his father and sister.  ROS:   Please see the history of present illness.    All other systems reviewed and are negative.  EKGs/Labs/Other Studies Reviewed:    The following studies were reviewed today: EVENT MONITOR REPORT:   Patient was monitored from 08/17/2019 to 08/31/2019. Indication:                    Paroxysmal atrial fibrillation Ordering physician:  Garwin Brothers, MD  Referring  physician:        Garwin Brothers, MD    Baseline rhythm: Sinus  Minimum heart rate: 43 BPM.  Average heart rate: 76 BPM.  Maximal heart rate 134 BPM.  In sinus rhythm and 218 in atrial fibrillation  Atrial arrhythmia: Paroxysmal atrial fibrillation, 13% burden.  Longest lasting 7 hours and 27 minutes at an average heart rate of 88   Ventricular arrhythmia: 6 episodes of nonsustained ventricular tachycardia the longest lasting 5 beats  Conduction abnormality: None significant  Symptoms: None significant   Conclusion:  Abnormal event monitor.  Paroxysmal atrial fibrillation with a prolonged episode of 7 hours and 27 minutes.  Nonsustained ventricular tachycardia 6 episodes the longest lasting 5 beats.  Interpreting  cardiologist: Garwin Brothers, MD  Date: 09/17/2019 1:09 PM     Recent Labs:  06/25/2019: ALT 11; BUN 13; Creat 1.01; Potassium 4.2; Sodium 139  Recent Lipid Panel    Component Value Date/Time   CHOL 182 06/25/2019 0912   TRIG 82 06/25/2019 0912   HDL 44 06/25/2019 0912   CHOLHDL 4.1 06/25/2019 0912   LDLCALC 120 (H) 06/25/2019 0912    Physical Exam:    VS:  BP (!) 136/74   Pulse 51   Ht 6' (1.829 m)   Wt (!) 265 lb 1.3 oz (120.2 kg)   SpO2 98%   BMI 35.95 kg/m     Wt Readings from Last 3 Encounters:  12/07/19 (!) 265 lb 1.3 oz (120.2 kg)  07/25/19 270 lb (122.5 kg)  06/25/19 275 lb (124.7 kg)     GEN: Patient is in no acute distress HEENT: Normal NECK: No JVD; No carotid bruits LYMPHATICS: No lymphadenopathy CARDIAC: S1 S2 regular, 2/6 systolic murmur at the apex. RESPIRATORY:  Clear to auscultation without rales, wheezing or rhonchi  ABDOMEN: Soft, non-tender, non-distended MUSCULOSKELETAL:  No edema; No deformity  SKIN: Warm and dry NEUROLOGIC:  Alert and oriented x 3 PSYCHIATRIC:  Normal affect    Signed, Garwin Brothers, MD  12/07/2019 9:13 AM    [facility]

## 2019-12-18 ENCOUNTER — Ambulatory Visit (HOSPITAL_COMMUNITY)
Admission: RE | Admit: 2019-12-18 | Discharge: 2019-12-18 | Disposition: A | Discharge status: Home / Self Care | Payer: BC Managed Care – PPO | Source: Ambulatory Visit | Attending: Cardiology | Admitting: Cardiology

## 2019-12-18 ENCOUNTER — Other Ambulatory Visit: Payer: Self-pay

## 2019-12-18 DIAGNOSIS — I517 Cardiomegaly: Secondary | ICD-10-CM | POA: Insufficient documentation

## 2019-12-18 DIAGNOSIS — I10 Essential (primary) hypertension: Secondary | ICD-10-CM | POA: Insufficient documentation

## 2019-12-18 DIAGNOSIS — R002 Palpitations: Secondary | ICD-10-CM | POA: Diagnosis not present

## 2019-12-18 DIAGNOSIS — E785 Hyperlipidemia, unspecified: Secondary | ICD-10-CM | POA: Insufficient documentation

## 2019-12-18 DIAGNOSIS — I480 Paroxysmal atrial fibrillation: Secondary | ICD-10-CM | POA: Insufficient documentation

## 2019-12-18 DIAGNOSIS — R011 Cardiac murmur, unspecified: Secondary | ICD-10-CM | POA: Diagnosis not present

## 2019-12-18 DIAGNOSIS — R001 Bradycardia, unspecified: Secondary | ICD-10-CM | POA: Diagnosis not present

## 2019-12-18 LAB — ECHOCARDIOGRAM COMPLETE
Area-P 1/2: 5.13 cm2
Calc EF: 45.2 %
S' Lateral: 4.15 cm
Single Plane A2C EF: 40.8 %
Single Plane A4C EF: 52 %

## 2019-12-18 NOTE — Progress Notes (Signed)
  Echocardiogram 2D Echocardiogram has been performed.  Ernest Fuentes 12/18/2019, 8:51 AM

## 2020-01-15 ENCOUNTER — Telehealth (HOSPITAL_COMMUNITY): Payer: Self-pay | Admitting: Emergency Medicine

## 2020-01-15 NOTE — Telephone Encounter (Signed)
VM box full, cannot leave new message  Crosby Oriordan RN Navigator Cardiac Imaging [facility] [phone] Office  [phone] Cell   

## 2020-01-16 ENCOUNTER — Other Ambulatory Visit: Payer: Self-pay

## 2020-01-16 ENCOUNTER — Ambulatory Visit (HOSPITAL_COMMUNITY)
Admission: RE | Admit: 2020-01-16 | Discharge: 2020-01-16 | Disposition: A | Discharge status: Home / Self Care | Payer: BC Managed Care – PPO | Source: Ambulatory Visit | Attending: Cardiology | Admitting: Cardiology

## 2020-01-16 DIAGNOSIS — I200 Unstable angina: Secondary | ICD-10-CM | POA: Insufficient documentation

## 2020-01-16 MED ORDER — IOHEXOL 350 MG/ML SOLN
80.0000 mL | Freq: Once | INTRAVENOUS | Status: AC | PRN
  Administered 2020-01-16: 80 mL via INTRAVENOUS

## 2020-01-16 MED ORDER — NITROGLYCERIN 0.4 MG SL SUBL
SUBLINGUAL_TABLET | SUBLINGUAL | Status: AC
  Filled 2020-01-16: qty 2

## 2020-01-16 MED ORDER — NITROGLYCERIN 0.4 MG SL SUBL
0.8000 mg | SUBLINGUAL_TABLET | Freq: Once | SUBLINGUAL | Status: AC
  Administered 2020-01-16: 0.8 mg via SUBLINGUAL

## 2020-01-17 ENCOUNTER — Telehealth: Payer: Self-pay

## 2020-01-17 NOTE — Telephone Encounter (Signed)
Tried calling patient. No answer and no voicemail set up for me to leave a message. 

## 2020-01-17 NOTE — Telephone Encounter (Signed)
-----   Message from Garwin Brothers, MD sent at 01/17/2020  8:13 AM EDT ----- Excellent results.  The results of the study is unremarkable. Please inform patient. I will discuss in detail at next appointment. Cc  primary care/referring physician Garwin Brothers, MD 01/17/2020 8:13 AM

## 2020-01-18 ENCOUNTER — Telehealth: Payer: Self-pay

## 2020-01-18 NOTE — Telephone Encounter (Signed)
-----   Message from Rajan R Revankar, MD sent at 01/17/2020  8:13 AM EDT ----- °Excellent results.  The results of the study is unremarkable. Please inform patient. I will discuss in detail at next appointment. Cc  primary care/referring physician °Rajan R Revankar, MD 01/17/2020 8:13 AM  °

## 2020-01-18 NOTE — Telephone Encounter (Addendum)
Tried calling patient. No answer and no voicemail set up for me to leave a message. 

## 2020-01-21 ENCOUNTER — Telehealth: Payer: Self-pay

## 2020-01-21 NOTE — Telephone Encounter (Signed)
No answer and no VM

## 2020-01-21 NOTE — Telephone Encounter (Signed)
-----   Message from Rajan R Revankar, MD sent at 01/17/2020  8:13 AM EDT ----- °Excellent results.  The results of the study is unremarkable. Please inform patient. I will discuss in detail at next appointment. Cc  primary care/referring physician °Rajan R Revankar, MD 01/17/2020 8:13 AM  °

## 2020-01-22 DIAGNOSIS — J449 Chronic obstructive pulmonary disease, unspecified: Secondary | ICD-10-CM | POA: Insufficient documentation

## 2020-01-22 DIAGNOSIS — I10 Essential (primary) hypertension: Secondary | ICD-10-CM | POA: Insufficient documentation

## 2020-01-23 ENCOUNTER — Ambulatory Visit: Payer: BC Managed Care – PPO | Admitting: Cardiology

## 2020-02-03 ENCOUNTER — Other Ambulatory Visit: Payer: Self-pay | Admitting: Physician Assistant

## 2020-03-09 ENCOUNTER — Other Ambulatory Visit: Payer: Self-pay | Admitting: Physician Assistant

## 2020-03-17 ENCOUNTER — Encounter: Payer: Self-pay | Admitting: Emergency Medicine

## 2020-03-17 ENCOUNTER — Other Ambulatory Visit: Payer: Self-pay

## 2020-03-17 ENCOUNTER — Emergency Department
Admission: EM | Admit: 2020-03-17 | Discharge: 2020-03-17 | Disposition: A | Discharge status: Home / Self Care | Payer: BC Managed Care – PPO | Source: Home / Self Care

## 2020-03-17 DIAGNOSIS — J40 Bronchitis, not specified as acute or chronic: Secondary | ICD-10-CM | POA: Diagnosis not present

## 2020-03-17 DIAGNOSIS — J069 Acute upper respiratory infection, unspecified: Secondary | ICD-10-CM

## 2020-03-17 DIAGNOSIS — R062 Wheezing: Secondary | ICD-10-CM

## 2020-03-17 MED ORDER — BENZONATATE 100 MG PO CAPS
100.0000 mg | ORAL_CAPSULE | Freq: Three times a day (TID) | ORAL | 0 refills | Status: DC
Start: 2020-03-17 — End: 2020-07-24

## 2020-03-17 MED ORDER — ALBUTEROL SULFATE HFA 108 (90 BASE) MCG/ACT IN AERS: 2.0000 | INHALATION_SPRAY | Freq: Four times a day (QID) | RESPIRATORY_TRACT | 0 refills | Status: DC | PRN

## 2020-03-17 MED ORDER — PREDNISONE 20 MG PO TABS
40.0000 mg | ORAL_TABLET | Freq: Every day | ORAL | 0 refills | Status: DC
Start: 2020-03-17 — End: 2020-07-24

## 2020-03-17 MED ORDER — DOXYCYCLINE HYCLATE 100 MG PO CAPS: 100.0000 mg | ORAL_CAPSULE | Freq: Two times a day (BID) | ORAL | 0 refills | Status: AC

## 2020-03-17 NOTE — Discharge Instructions (Signed)
Start prednisone 40 mg once daily with breakfast for 5 days.  Take doxycycline 100 mg twice daily with food over the next 7 days.  I have also prescribed you benzonatate Perles as needed for coughing. If wheezing or shortness of breath develop she may use the albuterol inhaler which I prescribed every 6 hours you may take 2 puffs as needed.  If any your symptoms worsen or become severe go immediately to closest emergency department.

## 2020-03-17 NOTE — ED Triage Notes (Signed)
Cough, wheezing started yesterday. Vaccinated

## 2020-03-17 NOTE — ED Provider Notes (Signed)
[name]    CSN: [id number] Arrival date & time: 03/17/20  3212      History   Chief Complaint Chief Complaint  Patient presents with  . Cough    HPI Ernest Fuentes is a 54 y.o. male.   HPI  Patient presents today with complaint of a gradually worsening cough which now has precipitated wheezing.  Patient has a history of COPD and cardiovascular disease.  Patient is afebrile and is fully vaccinated against COVID-19.  He denies any current use of any chronic inhalers.  Reports he has been free of any bronchitis or bronchial-like symptoms for an extended period of time.  He denies any chest tightness just is noticed some increased noises with breathing since early today.  His cough has now productive.  He denies any increased shortness of breath or work of breathing with activity.   Past Medical History:  Diagnosis Date  . Allergic drug reaction 09/25/2018  . Bradycardia 07/25/2019  . Cardiac murmur 12/07/2019  . Colon polyps 07/25/2019   Pending pathology.   [name] COPD (chronic obstructive pulmonary disease) (HCC)   . Essential hypertension 06/21/2018  . Family history of prostate cancer 09/11/2018  . Ganglion cyst of dorsum of right wrist 06/21/2018  . Hyperlipidemia 06/23/2018   CV risk 19.3 percent 06/2018. Discussed statin.   [name] Hypertension   . Irritant contact dermatitis due to detergent 09/12/2018  . OSA (obstructive sleep apnea) 06/21/2018  . PAF (paroxysmal atrial fibrillation) (HCC) 07/25/2019   Seen on EKG during colonoscopy 07/2019.  [name] Palpitation 07/25/2019  . Rash 06/25/2019  . Urticaria 09/25/2018    Patient Active Problem List   Diagnosis Date Noted  . COPD (chronic obstructive pulmonary disease) (HCC)   . Hypertension   . Cardiac murmur 12/07/2019  . PAF (paroxysmal atrial fibrillation) (HCC) 07/25/2019  . Bradycardia 07/25/2019  . Palpitation 07/25/2019  . Colon polyps 07/25/2019  . Rash 06/25/2019  . Urticaria 09/25/2018  . Allergic drug reaction  09/25/2018  . Irritant contact dermatitis due to detergent 09/12/2018  . Family history of prostate cancer 09/11/2018  . Hyperlipidemia 06/23/2018  . OSA (obstructive sleep apnea) 06/21/2018  . Ganglion cyst of dorsum of right wrist 06/21/2018  . Essential hypertension 06/21/2018    Past Surgical History:  Procedure Laterality Date  . NO PAST SURGERIES         Home Medications    Prior to Admission medications   Medication Sig Start Date End Date Taking? Authorizing Provider  amLODipine (NORVASC) 5 MG tablet Take 1 tablet (5 mg total) by mouth daily. 06/25/19   Breeback, Lonna Cobb, PA-C  apixaban (ELIQUIS) 5 MG TABS tablet Take 1 tablet (5 mg total) by mouth 2 (two) times daily. 07/25/19   Breeback, [name] L, PA-C  atenolol (TENORMIN) 25 MG tablet TAKE 1 TABLET (25 MG TOTAL) BY MOUTH DAILY. NEEDS APPT 03/10/20   Tandy Gaw L, PA-C  pravastatin (PRAVACHOL) 20 MG tablet Take 20 mg by mouth daily.    [provider]    Family History Family History  Problem Relation Age of Onset  . Hypertension Other   . Diabetes Other   . Cancer Other   . Hypertension Mother   . Stroke Father   . Hypertension Father   . Prostate cancer Father   . Stroke Sister     Social History Social History   Tobacco Use  . Smoking status: Former Games developer  . Smokeless tobacco: Current User  Vaping Use  .  Vaping Use: Never used  Substance Use Topics  . Alcohol use: Yes  . Drug use: Never     Allergies   Asa [aspirin] and Atorvastatin   Review of Systems Review of Systems Pertinent negatives listed in HPI Physical Exam Triage Vital Signs ED Triage Vitals  Enc Vitals Group     BP 03/17/20 0839 (!) 164/81     Pulse Rate 03/17/20 0839 60     Resp 03/17/20 0839 18     Temp 03/17/20 0839 98.3 F (36.8 C)     Temp Source 03/17/20 0839 Oral     SpO2 03/17/20 0839 98 %     Weight 03/17/20 0840 270 lb (122.5 kg)     Height 03/17/20 0840 6' (1.829 m)     Head Circumference --       Peak Flow --      Pain Score 03/17/20 0840 1     Pain Loc --      Pain Edu? --      Excl. in GC? --    No data found.  Updated Vital Signs BP (!) 164/81 (BP Location: Right Arm)   Pulse 60   Temp 98.3 F (36.8 C) (Oral)   Resp 18   Ht 6' (1.829 m)   Wt 270 lb (122.5 kg)   SpO2 98%   BMI 36.62 kg/m   Visual Acuity Right Eye Distance:   Left Eye Distance:   Bilateral Distance:    Right Eye Near:   Left Eye Near:    Bilateral Near:     Physical Exam Constitutional:      Appearance: He is obese.  HENT:     Head: Normocephalic.     Right Ear: External ear normal.     Left Ear: External ear normal.     Nose: Congestion and rhinorrhea present.  Eyes:     Extraocular Movements: Extraocular movements intact.     Pupils: Pupils are equal, round, and reactive to light.  Cardiovascular:     Rate and Rhythm: Normal rate and regular rhythm.  Pulmonary:     Comments: Lung sounds coarse, no crackles or rales noted.  No accessory muscle use.  No labored breathing.  Mildly audible expiratory wheeze noted on exam. Musculoskeletal:     Cervical back: Normal range of motion.  Skin:    General: Skin is warm.     Capillary Refill: Capillary refill takes less than 2 seconds.  Neurological:     Mental Status: He is alert.  Psychiatric:        Mood and Affect: Mood normal.        Behavior: Behavior normal.        Thought Content: Thought content normal.        Judgment: Judgment normal.      UC Treatments / Results  Labs (all labs ordered are listed, but only abnormal results are displayed) Labs Reviewed - No data to display  EKG   Radiology No results found.  Procedures Procedures (including critical care time)  Medications Ordered in UC Medications - No data to display  Initial Impression / Assessment and Plan / UC Course  I have reviewed the triage vital signs and the nursing notes.  Pertinent labs & imaging results that were available during my care of the  patient were reviewed by me and considered in my medical decision making (see chart for details).     Patient with a history of COPD presents today with cough  and wheezing will cover today with prednisone 40 mg once daily with breakfast.  Given comorbidities and evaluation findings will also cover with doxycycline 100 mg twice daily for 7 days.  For cough of also prescribed benzonatate as needed and wheezing albuterol every 6 hours 2 puffs as needed.  Patient advised to follow-up with the closest ER if symptoms worsen or do not improve.  Follow-up with PCP as needed. Final Clinical Impressions(s) / UC Diagnoses   Final diagnoses:  Bronchitis  Acute upper respiratory infection  Wheezing     Discharge Instructions     Start prednisone 40 mg once daily with breakfast for 5 days.  Take doxycycline 100 mg twice daily with food over the next 7 days.  I have also prescribed you benzonatate Perles as needed for coughing. If wheezing or shortness of breath develop she may use the albuterol inhaler which I prescribed every 6 hours you may take 2 puffs as needed.  If any your symptoms worsen or become severe go immediately to closest emergency department.    ED Prescriptions    Medication Sig Dispense Auth. Provider   doxycycline (VIBRAMYCIN) 100 MG capsule Take 1 capsule (100 mg total) by mouth 2 (two) times daily for 7 days. 14 capsule Bing Neighbors, FNP   predniSONE (DELTASONE) 20 MG tablet Take 2 tablets (40 mg total) by mouth daily with breakfast. 10 tablet Bing Neighbors, FNP   albuterol (VENTOLIN HFA) 108 (90 Base) MCG/ACT inhaler Inhale 2 puffs into the lungs every 6 (six) hours as needed for wheezing or shortness of breath. 6.7 g Bing Neighbors, FNP   benzonatate (TESSALON) 100 MG capsule Take 1 capsule (100 mg total) by mouth every 8 (eight) hours. 40 capsule Bing Neighbors, FNP     PDMP not reviewed this encounter.   Bing Neighbors, [place] 03/17/20 [phone]

## 2020-03-24 ENCOUNTER — Other Ambulatory Visit: Payer: Self-pay | Admitting: Physician Assistant

## 2020-03-29 ENCOUNTER — Other Ambulatory Visit: Payer: Self-pay | Admitting: Physician Assistant

## 2020-03-29 DIAGNOSIS — I480 Paroxysmal atrial fibrillation: Secondary | ICD-10-CM

## 2020-05-19 ENCOUNTER — Other Ambulatory Visit: Payer: Self-pay | Admitting: Physician Assistant

## 2020-05-19 DIAGNOSIS — I480 Paroxysmal atrial fibrillation: Secondary | ICD-10-CM

## 2020-06-29 ENCOUNTER — Other Ambulatory Visit: Payer: Self-pay | Admitting: Physician Assistant

## 2020-06-29 DIAGNOSIS — E782 Mixed hyperlipidemia: Secondary | ICD-10-CM

## 2020-06-29 DIAGNOSIS — I10 Essential (primary) hypertension: Secondary | ICD-10-CM

## 2020-07-22 DIAGNOSIS — IMO0001 Reserved for inherently not codable concepts without codable children: Secondary | ICD-10-CM

## 2020-07-22 DIAGNOSIS — J329 Chronic sinusitis, unspecified: Secondary | ICD-10-CM

## 2020-07-22 DIAGNOSIS — F17200 Nicotine dependence, unspecified, uncomplicated: Secondary | ICD-10-CM

## 2020-07-22 DIAGNOSIS — E669 Obesity, unspecified: Secondary | ICD-10-CM

## 2020-07-22 DIAGNOSIS — G4730 Sleep apnea, unspecified: Secondary | ICD-10-CM

## 2020-07-22 DIAGNOSIS — I10 Essential (primary) hypertension: Secondary | ICD-10-CM | POA: Insufficient documentation

## 2020-07-22 HISTORY — DX: Nicotine dependence, unspecified, uncomplicated: F17.200

## 2020-07-22 HISTORY — DX: Essential (primary) hypertension: I10

## 2020-07-22 HISTORY — DX: Reserved for inherently not codable concepts without codable children: IMO0001

## 2020-07-22 HISTORY — DX: Obesity, unspecified: E66.9

## 2020-07-22 HISTORY — DX: Sleep apnea, unspecified: G47.30

## 2020-07-22 HISTORY — DX: Chronic sinusitis, unspecified: J32.9

## 2020-07-24 ENCOUNTER — Other Ambulatory Visit: Payer: Self-pay

## 2020-07-24 ENCOUNTER — Encounter: Payer: Self-pay | Admitting: Cardiology

## 2020-07-24 ENCOUNTER — Ambulatory Visit: Payer: BC Managed Care – PPO | Admitting: Cardiology

## 2020-07-24 VITALS — BP 152/80 | HR 67 | Ht 72.0 in | Wt 274.0 lb

## 2020-07-24 DIAGNOSIS — E782 Mixed hyperlipidemia: Secondary | ICD-10-CM

## 2020-07-24 DIAGNOSIS — I480 Paroxysmal atrial fibrillation: Secondary | ICD-10-CM | POA: Diagnosis not present

## 2020-07-24 DIAGNOSIS — I700 Atherosclerosis of aorta: Secondary | ICD-10-CM

## 2020-07-24 DIAGNOSIS — I10 Essential (primary) hypertension: Secondary | ICD-10-CM | POA: Diagnosis not present

## 2020-07-24 DIAGNOSIS — I7090 Unspecified atherosclerosis: Secondary | ICD-10-CM

## 2020-07-24 HISTORY — DX: Unspecified atherosclerosis: I70.90

## 2020-07-24 HISTORY — DX: Atherosclerosis of aorta: I70.0

## 2020-07-24 MED ORDER — ATENOLOL 25 MG PO TABS: 25.0000 mg | ORAL_TABLET | Freq: Every day | ORAL | 3 refills | Status: DC

## 2020-07-24 MED ORDER — APIXABAN 5 MG PO TABS: ORAL_TABLET | ORAL | 3 refills | Status: DC

## 2020-07-24 NOTE — Progress Notes (Signed)
Cardiology Office Note:    Date:  07/24/2020   ID:  Ernest Fuentes, DOB 12-18-1965, MRN [id number]  PCP:  Jomarie Longs, PA-C  Cardiologist:  Garwin Brothers, MD   Referring MD: Jomarie Longs, PA-C    ASSESSMENT:    1. Mixed hyperlipidemia   2. Essential hypertension   3. PAF (paroxysmal atrial fibrillation) (HCC)   4. Atherosclerotic vascular disease   5. Aortic atherosclerosis (HCC)    PLAN:    In order of problems listed above:  1. Paroxysmal atrial fibrillation:I discussed with the patient atrial fibrillation, disease process. Management and therapy including rate and rhythm control, anticoagulation benefits and potential risks were discussed extensively with the patient. Patient had multiple questions which were answered to patient's satisfaction.  Patient is on anticoagulation.  Because of her aortic atherosclerosis his CHA2DS2-VASc score is 2.  I discussed anticoagulation.  He wants to continue it for now.  He is not sure about long-term had a lengthy discussion with him.  Finally I will leave it up to his discretion and judgment.  We will revisit this issue in 2 months. 2. Aortic atherosclerotic: Atherosclerotic vascular disease: Medical management at this time.  Calcium score is 0 and coronary arteries are fine on that report mentioned below. 3. Mixed dyslipidemia: Diet was emphasized.  He will have complete blood work today including fasting lipids 4. Obstructive sleep apnea: Sleep health issues were discussed.  I told him to pursue evaluation and treatment as this affects atrial fibrillation and his general health and he promises to do better. 5. Essential hypertension: Blood pressure stable and tells me it is much better at home.  Salt intake issues and diet was emphasized. 6. Patient will be seen in follow-up appointment in 2 months or earlier if the patient has any concerns    Medication Adjustments/Labs and Tests Ordered: Current medicines are reviewed at length  with the patient today.  Concerns regarding medicines are outlined above.  Orders Placed This Encounter  Procedures  . Basic metabolic panel  . CBC with Differential/Platelet  . TSH  . Hepatic function panel  . Lipid panel   Meds ordered this encounter  Medications  . atenolol (TENORMIN) 25 MG tablet    Sig: Take 1 tablet (25 mg total) by mouth daily.    Dispense:  90 tablet    Refill:  3  . apixaban (ELIQUIS) 5 MG TABS tablet    Sig: TAKE 1 TABLET (5 MG TOTAL) BY MOUTH 2 (TWO) TIMES DAILY.    Dispense:  180 tablet    Refill:  3     No chief complaint on file.    History of Present Illness:    Ernest Fuentes is a 55 y.o. male.  Patient has past medical history of paroxysmal atrial fibrillation, essential hypertension obesity and obstructive sleep apnea.  He denies any problems at this time and takes care of activities of daily living.  No chest pain orthopnea or PND.  At the time of my evaluation, the patient is alert awake oriented and in no distress.  He works full-time and has a physical job.  He has not experienced any palpitations.  Past Medical History:  Diagnosis Date  . Allergic drug reaction 09/25/2018  . Benign essential hypertension 07/22/2020  . Bradycardia 07/25/2019  . Cardiac murmur 12/07/2019  . Chronic sinusitis 07/22/2020  . Colon polyps 07/25/2019   Pending pathology.   [name] COPD (chronic obstructive pulmonary disease) (HCC)   . Essential  hypertension 06/21/2018  . Family history of prostate cancer 09/11/2018  . Ganglion cyst of dorsum of right wrist 06/21/2018  . Hyperlipidemia 06/23/2018   CV risk 19.3 percent 06/2018. Discussed statin.   [name] Hypertension   . Irritant contact dermatitis due to detergent 09/12/2018  . Obesity 07/22/2020  . OSA (obstructive sleep apnea) 06/21/2018  . PAF (paroxysmal atrial fibrillation) (HCC) 07/25/2019   Seen on EKG during colonoscopy 07/2019.  [name] Palpitation 07/25/2019  . Rash 06/25/2019  . Reason for consultation 07/22/2020  . Sleep  apnea 07/22/2020  . Tobacco use disorder 07/22/2020   Jul 29, 2008 Entered By: Huston Foley C Comment: remitting  . Urticaria 09/25/2018    Past Surgical History:  Procedure Laterality Date  . NO PAST SURGERIES      Current Medications: Current Meds  Medication Sig  . albuterol (VENTOLIN HFA) 108 (90 Base) MCG/ACT inhaler Inhale 2 puffs into the lungs every 6 (six) hours as needed for wheezing or shortness of breath.  [name] amLODipine (NORVASC) 5 MG tablet Take 1 tablet (5 mg total) by mouth daily. appt for further refills  . pravastatin (PRAVACHOL) 20 MG tablet Take 1 tablet (20 mg total) by mouth daily. appt for further refills     Allergies:   Asa [aspirin] and Atorvastatin   Social History   Socioeconomic History  . Marital status: Single    Spouse name: Not on file  . Number of children: Not on file  . Years of education: Not on file  . Highest education level: Not on file  Occupational History  . Not on file  Tobacco Use  . Smoking status: Former Games developer  . Smokeless tobacco: Current User  Vaping Use  . Vaping Use: Never used  Substance and Sexual Activity  . Alcohol use: Yes  . Drug use: Never  . Sexual activity: Yes  Other Topics Concern  . Not on file  Social History Narrative  . Not on file   Social Determinants of Health   Financial Resource Strain: Not on file  Food Insecurity: Not on file  Transportation Needs: Not on file  Physical Activity: Not on file  Stress: Not on file  Social Connections: Not on file     Family History: The patient's family history includes Cancer in an other family member; Diabetes in an other family member; Hypertension in his father, mother, and another family member; Prostate cancer in his father; Stroke in his father and sister.  ROS:   Please see the history of present illness.    All other systems reviewed and are negative.  EKGs/Labs/Other Studies Reviewed:    The following studies were reviewed  today: IMPRESSION: 1. No evidence of CAD, CADRADS = 0.  2. Coronary calcium score of 0. This was 0 percentile for age and sex matched control.  3. Normal coronary origin with right dominance.  4.  Aortic atherosclerosis   Electronically Signed   By: Jodelle Red M.D.   On: 01/16/2020 19:08   IMPRESSIONS    1. Left ventricular ejection fraction, by estimation, is 45 to 50%. The  left ventricle has mildly decreased function. The left ventricle  demonstrates global hypokinesis. The left ventricular internal cavity size  was mildly dilated. There is moderate  left ventricular hypertrophy. Left ventricular diastolic parameters were  normal.  2. Right ventricular systolic function is normal. The right ventricular  size is normal.  3. The mitral valve is normal in structure. Trivial mitral valve  regurgitation. No evidence of  mitral stenosis.  4. The aortic valve is tricuspid. Aortic valve regurgitation is not  visualized. Mild aortic valve sclerosis is present, with no evidence of  aortic valve stenosis.  5. The inferior vena cava is normal in size with greater than 50%  respiratory variability, suggesting right atrial pressure of 3 mmHg.    Recent Labs: No results found for requested labs within last 8760 hours.  Recent Lipid Panel    Component Value Date/Time   CHOL 182 06/25/2019 0912   TRIG 82 06/25/2019 0912   HDL 44 06/25/2019 0912   CHOLHDL 4.1 06/25/2019 0912   LDLCALC 120 (H) 06/25/2019 0912    Physical Exam:    VS:  BP (!) 152/80   Pulse 67   Ht 6' (1.829 m)   Wt 274 lb (124.3 kg)   SpO2 97%   BMI 37.16 kg/m     Wt Readings from Last 3 Encounters:  07/24/20 274 lb (124.3 kg)  03/17/20 270 lb (122.5 kg)  12/07/19 (!) 265 lb 1.3 oz (120.2 kg)     GEN: Patient is in no acute distress HEENT: Normal NECK: No JVD; No carotid bruits LYMPHATICS: No lymphadenopathy CARDIAC: Hear sounds regular, 2/6 systolic murmur at the  apex. RESPIRATORY:  Clear to auscultation without rales, wheezing or rhonchi  ABDOMEN: Soft, non-tender, non-distended MUSCULOSKELETAL:  No edema; No deformity  SKIN: Warm and dry NEUROLOGIC:  Alert and oriented x 3 PSYCHIATRIC:  Normal affect   Signed, Garwin Brothers, MD  07/24/2020 8:31 AM    [facility]

## 2020-07-24 NOTE — Patient Instructions (Signed)
Medication Instructions:  Your physician recommends that you continue on your current medications as directed. Please refer to the Current Medication list given to you today.  *If you need a refill on your cardiac medications before your next appointment, please call your pharmacy*   Lab Work: BMET, CBC, TSH, LFT, Lipids If you have labs (blood work) drawn today and your tests are completely normal, you will receive your results only by: [name] MyChart Message (if you have MyChart) OR . A paper copy in the mail If you have any lab test that is abnormal or we need to change your treatment, we will call you to review the results.   Testing/Procedures: None   Follow-Up: At [facility], you and your health needs are our priority.  As part of our continuing mission to provide you with exceptional heart care, we have created designated Provider Care Teams.  These Care Teams include your primary Cardiologist (physician) and Advanced Practice Providers (APPs -  Physician Assistants and Nurse Practitioners) who all work together to provide you with the care you need, when you need it.  We recommend signing up for the patient portal called "MyChart".  Sign up information is provided on this After Visit Summary.  MyChart is used to connect with patients for Virtual Visits (Telemedicine).  Patients are able to view lab/test results, encounter notes, upcoming appointments, etc.  Non-urgent messages can be sent to your provider as well.   To learn more about what you can do with MyChart, go to [email or web address].    Your next appointment:   2 month(s)  The format for your next appointment:   In Person  Provider:   Belva Crome, MD   Other Instructions

## 2020-07-25 LAB — HEPATIC FUNCTION PANEL
ALT: 14 IU/L (ref 0–44)
AST: 30 IU/L (ref 0–40)
Albumin: 4.3 g/dL (ref 3.8–4.9)
Alkaline Phosphatase: 120 IU/L (ref 44–121)
Bilirubin Total: 0.7 mg/dL (ref 0.0–1.2)
Bilirubin, Direct: 0.19 mg/dL (ref 0.00–0.40)
Total Protein: 7.3 g/dL (ref 6.0–8.5)

## 2020-07-25 LAB — BASIC METABOLIC PANEL
BUN/Creatinine Ratio: 13 (ref 9–20)
BUN: 13 mg/dL (ref 6–24)
CO2: 22 mmol/L (ref 20–29)
Calcium: 9.9 mg/dL (ref 8.7–10.2)
Chloride: 102 mmol/L (ref 96–106)
Creatinine, Ser: 1 mg/dL (ref 0.76–1.27)
Glucose: 73 mg/dL (ref 65–99)
Potassium: 4.5 mmol/L (ref 3.5–5.2)
Sodium: 140 mmol/L (ref 134–144)
eGFR: 88 mL/min/{1.73_m2} (ref >59)

## 2020-07-25 LAB — CBC WITH DIFFERENTIAL/PLATELET
Basophils Absolute: 0 10*3/uL (ref 0.0–0.2)
Basos: 1 %
EOS (ABSOLUTE): 0.3 10*3/uL (ref 0.0–0.4)
Eos: 4 %
Hematocrit: 42.5 % (ref 37.5–51.0)
Hemoglobin: 14.3 g/dL (ref 13.0–17.7)
Immature Grans (Abs): 0 10*3/uL (ref 0.0–0.1)
Immature Granulocytes: 0 %
Lymphocytes Absolute: 1.9 10*3/uL (ref 0.7–3.1)
Lymphs: 30 %
MCH: 31 pg (ref 26.6–33.0)
MCHC: 33.6 g/dL (ref 31.5–35.7)
MCV: 92 fL (ref 79–97)
Monocytes Absolute: 0.6 10*3/uL (ref 0.1–0.9)
Monocytes: 10 %
Neutrophils Absolute: 3.5 10*3/uL (ref 1.4–7.0)
Neutrophils: 55 %
Platelets: 246 10*3/uL (ref 150–450)
RBC: 4.61 x10E6/uL (ref 4.14–5.80)
RDW: 11.7 % (ref 11.6–15.4)
WBC: 6.2 10*3/uL (ref 3.4–10.8)

## 2020-07-25 LAB — LIPID PANEL
Chol/HDL Ratio: 4.5 ratio (ref 0.0–5.0)
Cholesterol, Total: 188 mg/dL (ref 100–199)
HDL: 42 mg/dL (ref >39)
LDL Chol Calc (NIH): 129 mg/dL — ABNORMAL HIGH (ref 0–99)
Triglycerides: 94 mg/dL (ref 0–149)
VLDL Cholesterol Cal: 17 mg/dL (ref 5–40)

## 2020-07-25 LAB — TSH: TSH: 1.79 u[IU]/mL (ref 0.450–4.500)

## 2020-09-26 ENCOUNTER — Other Ambulatory Visit: Payer: Self-pay | Admitting: Physician Assistant

## 2020-09-26 DIAGNOSIS — E782 Mixed hyperlipidemia: Secondary | ICD-10-CM

## 2020-09-26 DIAGNOSIS — I10 Essential (primary) hypertension: Secondary | ICD-10-CM

## 2020-10-09 ENCOUNTER — Ambulatory Visit: Payer: BC Managed Care – PPO | Admitting: Cardiology

## 2020-10-18 ENCOUNTER — Other Ambulatory Visit: Payer: Self-pay

## 2020-10-18 ENCOUNTER — Emergency Department (INDEPENDENT_AMBULATORY_CARE_PROVIDER_SITE_OTHER)
Admission: EM | Admit: 2020-10-18 | Discharge: 2020-10-18 | Disposition: A | Discharge status: Home / Self Care | Payer: BC Managed Care – PPO | Source: Home / Self Care

## 2020-10-18 ENCOUNTER — Encounter: Payer: Self-pay | Admitting: Emergency Medicine

## 2020-10-18 DIAGNOSIS — H6123 Impacted cerumen, bilateral: Secondary | ICD-10-CM | POA: Diagnosis not present

## 2020-10-18 NOTE — ED Triage Notes (Signed)
Ear fullness since Thursday Left side  Debrox - no relief  Denies fever or chills  Pfizer booster 2 weeks ago

## 2020-10-18 NOTE — ED Provider Notes (Signed)
[facility]    CSN: [id number] Arrival date & time: 10/18/20  0904      History   Chief Complaint Chief Complaint  Patient presents with   Ear Fullness    left    HPI Ernest Fuentes is a 55 y.o. male.   HPI 55 year old male presents with bilateral otalgia, ear fullness for 3 days.  Patient reports left is worse than right and OTC Debrox has provided no relief.  Past Medical History:  Diagnosis Date   Allergic drug reaction 09/25/2018   Aortic atherosclerosis (HCC) 07/24/2020   Atherosclerotic vascular disease 07/24/2020   Benign essential hypertension 07/22/2020   Bradycardia 07/25/2019   Cardiac murmur 12/07/2019   Chronic sinusitis 07/22/2020   Colon polyps 07/25/2019   Pending pathology.    COPD (chronic obstructive pulmonary disease) (HCC)    Essential hypertension 06/21/2018   Family history of prostate cancer 09/11/2018   Ganglion cyst of dorsum of right wrist 06/21/2018   Hyperlipidemia 06/23/2018   CV risk 19.3 percent 06/2018. Discussed statin.    Hypertension    Irritant contact dermatitis due to detergent 09/12/2018   Obesity 07/22/2020   OSA (obstructive sleep apnea) 06/21/2018   PAF (paroxysmal atrial fibrillation) (HCC) 07/25/2019   Seen on EKG during colonoscopy 07/2019.   Palpitation 07/25/2019   Rash 06/25/2019   Reason for consultation 07/22/2020   Sleep apnea 07/22/2020   Tobacco use disorder 07/22/2020   Jul 29, 2008 Entered By: Huston Foley C Comment: remitting   Urticaria 09/25/2018    Patient Active Problem List   Diagnosis Date Noted   Atherosclerotic vascular disease 07/24/2020   Aortic atherosclerosis (HCC) 07/24/2020   Chronic sinusitis 07/22/2020   Obesity 07/22/2020   Reason for consultation 07/22/2020   Tobacco use disorder 07/22/2020   Benign essential hypertension 07/22/2020   Sleep apnea 07/22/2020   COPD (chronic obstructive pulmonary disease) (HCC)    Hypertension    Cardiac murmur 12/07/2019   PAF (paroxysmal atrial  fibrillation) (HCC) 07/25/2019   Bradycardia 07/25/2019   Palpitation 07/25/2019   Colon polyps 07/25/2019   Rash 06/25/2019   Urticaria 09/25/2018   Allergic drug reaction 09/25/2018   Irritant contact dermatitis due to detergent 09/12/2018   Family history of prostate cancer 09/11/2018   Hyperlipidemia 06/23/2018   OSA (obstructive sleep apnea) 06/21/2018   Ganglion cyst of dorsum of right wrist 06/21/2018   Essential hypertension 06/21/2018    Past Surgical History:  Procedure Laterality Date   NO PAST SURGERIES         Home Medications    Prior to Admission medications   Medication Sig Start Date End Date Taking? Authorizing Provider  amLODipine (NORVASC) 5 MG tablet Take 5 mg by mouth daily.   Yes [provider]  atenolol (TENORMIN) 25 MG tablet Take 1 tablet (25 mg total) by mouth daily. 07/24/20  Yes Revankar, Aundra Dubin, MD  albuterol (VENTOLIN HFA) 108 (90 Base) MCG/ACT inhaler Inhale 2 puffs into the lungs every 6 (six) hours as needed for wheezing or shortness of breath. 03/17/20   Bing Neighbors, FNP  apixaban (ELIQUIS) 5 MG TABS tablet Take 5 mg by mouth 2 (two) times daily. Patient not taking: Reported on 10/18/2020    [provider]  pravastatin (PRAVACHOL) 20 MG tablet Take 20 mg by mouth daily. Patient not taking: Reported on 10/18/2020    [provider]    Family History Family History  Problem Relation Age of Onset   Hypertension Mother  Stroke Father    Hypertension Father    Prostate cancer Father    Stroke Sister    Hypertension Other    Diabetes Other    Cancer Other     Social History Social History   Tobacco Use   Smoking status: Former    Pack years: 0.00   Smokeless tobacco: Current  Vaping Use   Vaping Use: Never used  Substance Use Topics   Alcohol use: Yes    Alcohol/week: 6.0 standard drinks    Types: 6 Cans of beer per week   Drug use: Never     Allergies   Asa [aspirin] and  Atorvastatin   Review of Systems Review of Systems  Constitutional: Negative.   HENT:  Positive for ear pain.        Bilateral ear fullness  Eyes: Negative.   Respiratory: Negative.    Cardiovascular: Negative.   Gastrointestinal: Negative.   Genitourinary: Negative.   Musculoskeletal: Negative.   Neurological: Negative.     Physical Exam Triage Vital Signs ED Triage Vitals  Enc Vitals Group     BP 10/18/20 0940 (!) 148/78     Pulse Rate 10/18/20 0940 (!) 58     Resp 10/18/20 0940 15     Temp 10/18/20 0940 99.3 F (37.4 C)     Temp Source 10/18/20 0940 Oral     SpO2 10/18/20 0940 98 %     Weight --      Height --      Head Circumference --      Peak Flow --      Pain Score 10/18/20 0941 1     Pain Loc --      Pain Edu? --      Excl. in GC? --    No data found.  Updated Vital Signs BP (!) 148/78 (BP Location: Right Arm)   Pulse (!) 58   Temp 99.3 F (37.4 C) (Oral)   Resp 15   SpO2 98%   Physical Exam Vitals and nursing note reviewed.  Constitutional:      General: He is not in acute distress.    Appearance: Normal appearance. He is normal weight. He is not ill-appearing.  HENT:     Head: Normocephalic and atraumatic.     Right Ear: There is impacted cerumen.     Left Ear: There is impacted cerumen.     Ears:     Comments: Bilateral EACs impacted with cerumen unable to visualize either TM.  Post bilateral EAC lavage: EAC's clear, mildly erythematous, TM's clear mildly retracted with good light reflex noted    Mouth/Throat:     Mouth: Mucous membranes are moist.     Pharynx: Oropharynx is clear.  Eyes:     Extraocular Movements: Extraocular movements intact.     Conjunctiva/sclera: Conjunctivae normal.     Pupils: Pupils are equal, round, and reactive to light.  Cardiovascular:     Rate and Rhythm: Normal rate and regular rhythm.     Pulses: Normal pulses.     Heart sounds: Normal heart sounds.  Pulmonary:     Effort: Pulmonary effort is normal.      Breath sounds: Normal breath sounds.     Comments: No adventitious breath sounds noted Musculoskeletal:        General: Normal range of motion.     Cervical back: Normal range of motion and neck supple. No tenderness.  Lymphadenopathy:     Cervical: No cervical adenopathy.  Skin:    General: Skin is warm and dry.  Neurological:     General: No focal deficit present.     Mental Status: He is alert and oriented to person, place, and time.  Psychiatric:        Mood and Affect: Mood normal.        Behavior: Behavior normal.     UC Treatments / Results  Labs (all labs ordered are listed, but only abnormal results are displayed) Labs Reviewed - No data to display  EKG   Radiology No results found.  Procedures Procedures (including critical care time)  Medications Ordered in UC Medications - No data to display  Initial Impression / Assessment and Plan / UC Course  I have reviewed the triage vital signs and the nursing notes.  Pertinent labs & imaging results that were available during my care of the patient were reviewed by me and considered in my medical decision making (see chart for details).     MDM: 1.  Bilateral cerumen impaction.  Discharged home, hemodynamically stable. Final Clinical Impressions(s) / UC Diagnoses   Final diagnoses:  Bilateral impacted cerumen   Discharge Instructions   None    ED Prescriptions   None    PDMP not reviewed this encounter.   Trevor Iha, FNP 10/18/20 1029

## 2020-10-30 ENCOUNTER — Other Ambulatory Visit: Payer: Self-pay | Admitting: Physician Assistant

## 2020-10-30 DIAGNOSIS — E782 Mixed hyperlipidemia: Secondary | ICD-10-CM

## 2020-10-30 DIAGNOSIS — I10 Essential (primary) hypertension: Secondary | ICD-10-CM

## 2020-11-08 ENCOUNTER — Other Ambulatory Visit: Payer: Self-pay | Admitting: Physician Assistant

## 2020-11-08 DIAGNOSIS — I10 Essential (primary) hypertension: Secondary | ICD-10-CM

## 2020-11-08 DIAGNOSIS — E782 Mixed hyperlipidemia: Secondary | ICD-10-CM

## 2020-11-14 ENCOUNTER — Ambulatory Visit (INDEPENDENT_AMBULATORY_CARE_PROVIDER_SITE_OTHER): Payer: BC Managed Care – PPO | Admitting: Physician Assistant

## 2020-11-14 ENCOUNTER — Encounter: Payer: Self-pay | Admitting: Physician Assistant

## 2020-11-14 ENCOUNTER — Other Ambulatory Visit: Payer: Self-pay

## 2020-11-14 VITALS — BP 145/67 | HR 58 | Ht 72.0 in | Wt 281.0 lb

## 2020-11-14 DIAGNOSIS — Z125 Encounter for screening for malignant neoplasm of prostate: Secondary | ICD-10-CM | POA: Diagnosis not present

## 2020-11-14 DIAGNOSIS — T50Z95D Adverse effect of other vaccines and biological substances, subsequent encounter: Secondary | ICD-10-CM

## 2020-11-14 DIAGNOSIS — R0683 Snoring: Secondary | ICD-10-CM

## 2020-11-14 DIAGNOSIS — E782 Mixed hyperlipidemia: Secondary | ICD-10-CM

## 2020-11-14 DIAGNOSIS — I10 Essential (primary) hypertension: Secondary | ICD-10-CM

## 2020-11-14 DIAGNOSIS — I480 Paroxysmal atrial fibrillation: Secondary | ICD-10-CM

## 2020-11-14 DIAGNOSIS — I7090 Unspecified atherosclerosis: Secondary | ICD-10-CM

## 2020-11-14 DIAGNOSIS — F32A Depression, unspecified: Secondary | ICD-10-CM

## 2020-11-14 DIAGNOSIS — Z0000 Encounter for general adult medical examination without abnormal findings: Secondary | ICD-10-CM | POA: Diagnosis not present

## 2020-11-14 DIAGNOSIS — F419 Anxiety disorder, unspecified: Secondary | ICD-10-CM

## 2020-11-14 DIAGNOSIS — T50Z95A Adverse effect of other vaccines and biological substances, initial encounter: Secondary | ICD-10-CM

## 2020-11-14 DIAGNOSIS — G478 Other sleep disorders: Secondary | ICD-10-CM

## 2020-11-14 DIAGNOSIS — G4733 Obstructive sleep apnea (adult) (pediatric): Secondary | ICD-10-CM

## 2020-11-14 HISTORY — DX: Depression, unspecified: F32.A

## 2020-11-14 HISTORY — DX: Adverse effect of other vaccines and biological substances, initial encounter: T50.Z95A

## 2020-11-14 MED ORDER — APIXABAN 5 MG PO TABS: 5.0000 mg | ORAL_TABLET | Freq: Two times a day (BID) | ORAL | 3 refills | Status: DC

## 2020-11-14 MED ORDER — ATENOLOL 25 MG PO TABS: 25.0000 mg | ORAL_TABLET | Freq: Every day | ORAL | 3 refills | Status: DC

## 2020-11-14 MED ORDER — PRAVASTATIN SODIUM 40 MG PO TABS: 40.0000 mg | ORAL_TABLET | Freq: Every day | ORAL | 3 refills | Status: DC

## 2020-11-14 MED ORDER — AMLODIPINE BESYLATE 5 MG PO TABS: ORAL_TABLET | ORAL | 3 refills | Status: DC

## 2020-11-14 NOTE — Patient Instructions (Signed)
Health Maintenance, Male Adopting a healthy lifestyle and getting preventive care are important in promoting health and wellness. Ask your health care provider about: The right schedule for you to have regular tests and exams. Things you can do on your own to prevent diseases and keep yourself healthy. What should I know about diet, weight, and exercise? Eat a healthy diet  Eat a diet that includes plenty of vegetables, fruits, low-fat dairy products, and lean protein. Do not eat a lot of foods that are high in solid fats, added sugars, or sodium.  Maintain a healthy weight Body mass index (BMI) is a measurement that can be used to identify possible weight problems. It estimates body fat based on height and weight. Your health care provider can help determine your BMI and help you achieve or maintain ahealthy weight. Get regular exercise Get regular exercise. This is one of the most important things you can do for your health. Most adults should: Exercise for at least 150 minutes each week. The exercise should increase your heart rate and make you sweat (moderate-intensity exercise). Do strengthening exercises at least twice a week. This is in addition to the moderate-intensity exercise. Spend less time sitting. Even light physical activity can be beneficial. Watch cholesterol and blood lipids Have your blood tested for lipids and cholesterol at 57 years of age, then havethis test every 5 years. You may need to have your cholesterol levels checked more often if: Your lipid or cholesterol levels are high. You are older than 57 years of age. You are at high risk for heart disease. What should I know about cancer screening? Many types of cancers can be detected early and may often be prevented. Depending on your health history and family history, you may need to have cancer screening at various ages. This may include screening for: Colorectal cancer. Prostate cancer. Skin cancer. Lung  cancer. What should I know about heart disease, diabetes, and high blood pressure? Blood pressure and heart disease High blood pressure causes heart disease and increases the risk of stroke. This is more likely to develop in people who have high blood pressure readings, are of African descent, or are overweight. Talk with your health care provider about your target blood pressure readings. Have your blood pressure checked: Every 3-5 years if you are 18-39 years of age. Every year if you are 40 years old or older. If you are between the ages of 65 and 75 and are a current or former smoker, ask your health care provider if you should have a one-time screening for abdominal aortic aneurysm (AAA). Diabetes Have regular diabetes screenings. This checks your fasting blood sugar level. Have the screening done: Once every three years after age 45 if you are at a normal weight and have a low risk for diabetes. More often and at a younger age if you are overweight or have a high risk for diabetes. What should I know about preventing infection? Hepatitis B If you have a higher risk for hepatitis B, you should be screened for this virus. Talk with your health care provider to find out if you are at risk forhepatitis B infection. Hepatitis C Blood testing is recommended for: Everyone born from 1945 through 1965. Anyone with known risk factors for hepatitis C. Sexually transmitted infections (STIs) You should be screened each year for STIs, including gonorrhea and chlamydia, if: You are sexually active and are younger than 57 years of age. You are older than 57 years of age   and your health care provider tells you that you are at risk for this type of infection. Your sexual activity has changed since you were last screened, and you are at increased risk for chlamydia or gonorrhea. Ask your health care provider if you are at risk. Ask your health care provider about whether you are at high risk for HIV.  Your health care provider may recommend a prescription medicine to help prevent HIV infection. If you choose to take medicine to prevent HIV, you should first get tested for HIV. You should then be tested every 3 months for as long as you are taking the medicine. Follow these instructions at home: Lifestyle Do not use any products that contain nicotine or tobacco, such as cigarettes, e-cigarettes, and chewing tobacco. If you need help quitting, ask your health care provider. Do not use street drugs. Do not share needles. Ask your health care provider for help if you need support or information about quitting drugs. Alcohol use Do not drink alcohol if your health care provider tells you not to drink. If you drink alcohol: Limit how much you have to 0-2 drinks a day. Be aware of how much alcohol is in your drink. In the [place] one drink equals one 12 oz bottle of beer (355 mL), one 5 oz glass of wine (148 mL), or one 1 oz glass of hard liquor (44 mL). General instructions Schedule regular health, dental, and eye exams. Stay current with your vaccines. Tell your health care provider if: You often feel depressed. You have ever been abused or do not feel safe at home. Summary Adopting a healthy lifestyle and getting preventive care are important in promoting health and wellness. Follow your health care provider's instructions about healthy diet, exercising, and getting tested or screened for diseases. Follow your health care provider's instructions on monitoring your cholesterol and blood pressure. This information is not intended to replace advice given to you by your health care provider. Make sure you discuss any questions you have with your healthcare provider. Document Revised: 04/19/2018 Document Reviewed: 04/19/2018 Elsevier Patient Education  2022 Elsevier Inc.  

## 2020-11-14 NOTE — Progress Notes (Signed)
Subjective:    Patient ID: Ernest Fuentes, male    DOB: [birth year], 55 y.o.   MRN: [id number]  HPI Pt is a 55 yo obese male with HTN, PAF, Aortic atherosclerosis, COPD, OSA who presents to the clinic for medication refills and preventative health.   He is doing well today. He is almost out of medications.    .. Active Ambulatory Problems    Diagnosis Date Noted   OSA (obstructive sleep apnea) 06/21/2018   Ganglion cyst of dorsum of right wrist 06/21/2018   Essential hypertension 06/21/2018   Hyperlipidemia 06/23/2018   Family history of prostate cancer 09/11/2018   Irritant contact dermatitis due to detergent 09/12/2018   Urticaria 09/25/2018   Allergic drug reaction 09/25/2018   Rash 06/25/2019   PAF (paroxysmal atrial fibrillation) (HCC) 07/25/2019   Bradycardia 07/25/2019   Palpitation 07/25/2019   Colon polyps 07/25/2019   Cardiac murmur 12/07/2019   COPD (chronic obstructive pulmonary disease) (HCC)    Hypertension    Chronic sinusitis 07/22/2020   Obesity 07/22/2020   Reason for consultation 07/22/2020   Tobacco use disorder 07/22/2020   Benign essential hypertension 07/22/2020   Sleep apnea 07/22/2020   Atherosclerotic vascular disease 07/24/2020   Aortic atherosclerosis (HCC) 07/24/2020   Immunization reaction 11/14/2020   Anxiety and depression 11/14/2020   Resolved Ambulatory Problems    Diagnosis Date Noted   Current smoker 06/23/2018   Urinary incontinence 09/11/2018   No Additional Past Medical History   .[name] Family History  Problem Relation Age of Onset   Hypertension Mother    Stroke Father    Hypertension Father    Prostate cancer Father    Stroke Sister    Hypertension Other    Diabetes Other    Cancer Other    .[name] Social History   Socioeconomic History   Marital status: Single    Spouse name: Not on file   Number of children: Not on file   Years of education: Not on file   Highest education level: Not on file  Occupational History    Not on file  Tobacco Use   Smoking status: Former    Pack years: 0.00   Smokeless tobacco: Current  Vaping Use   Vaping Use: Never used  Substance and Sexual Activity   Alcohol use: Yes    Alcohol/week: 6.0 standard drinks    Types: 6 Cans of beer per week   Drug use: Never   Sexual activity: Yes  Other Topics Concern   Not on file  Social History Narrative   Not on file   Social Determinants of Health   Financial Resource Strain: Not on file  Food Insecurity: Not on file  Transportation Needs: Not on file  Physical Activity: Not on file  Stress: Not on file  Social Connections: Not on file  Intimate Partner Violence: Not on file     Review of Systems  All other systems reviewed and are negative.     Objective:   Physical Exam BP (!) 145/67   Pulse (!) 58   Ht 6' (1.829 m)   Wt 281 lb (127.5 kg)   SpO2 100%   BMI 38.11 kg/m   General Appearance:    Alert, cooperative, no distress, appears stated age  Head:    Normocephalic, without obvious abnormality, atraumatic  Eyes:    PERRL, conjunctiva/corneas clear, EOM's intact, fundi    benign, both eyes       Ears:    Normal  TM's and external ear canals, both ears  Nose:   Nares normal, septum midline, mucosa normal, no drainage    or sinus tenderness  Throat:   Lips, mucosa, and tongue normal; teeth and gums normal  Neck:   Supple, symmetrical, trachea midline, no adenopathy;       thyroid:  No enlargement/tenderness/nodules; no carotid   bruit or JVD  Back:     Symmetric, no curvature, ROM normal, no CVA tenderness  Lungs:     Clear to auscultation bilaterally, respirations unlabored  Chest wall:    No tenderness or deformity  Heart:    Regular rate and rhythm, S1 and S2 normal, no murmur, rub   or gallop  Abdomen:     Soft, non-tender, bowel sounds active all four quadrants,    no masses, no organomegaly        Extremities:   Extremities normal, atraumatic, no cyanosis or edema  Pulses:   2+ and symmetric  all extremities  Skin:   Skin color, texture, turgor normal, no rashes or lesions  Lymph nodes:   Cervical, supraclavicular, and axillary nodes normal  Neurologic:   CNII-XII intact. Normal strength, sensation and reflexes      throughout        .[name] Depression screen [facility] 2/9 11/14/2020 06/25/2019 11/27/2018 06/21/2018  Decreased Interest 1 0 0 2  Down, Depressed, Hopeless 2 0 0 0  PHQ - 2 Score 3 0 0 2  Altered sleeping 3 0 - 0  Tired, decreased energy 2 0 - 0  Change in appetite 1 0 - 0  Feeling bad or failure about yourself  1 0 - 0  Trouble concentrating 1 0 - 0  Moving slowly or fidgety/restless 1 0 - 0  Suicidal thoughts 0 0 - 0  PHQ-9 Score 12 0 - 2  Difficult doing work/chores Somewhat difficult Not difficult at all - Not difficult at all   .[name] GAD 7 : Generalized Anxiety Score 11/14/2020 06/25/2019 11/27/2018 06/21/2018  Nervous, Anxious, on Edge 2 0 0 0  Control/stop worrying 2 0 0 1  Worry too much - different things 2 1 0 0  Trouble relaxing 2 1 3  0  Restless 2 0 3 0  Easily annoyed or irritable 1 0 0 0  Afraid - awful might happen 2 0 0 0  Total GAD 7 Score 13 2 6 1   Anxiety Difficulty Somewhat difficult Not difficult at all Not difficult at all Not difficult at all   .  RO-AUA SYMPTOM     Row Name 11/14/20 0900         During the last Month   Sensation of Bladder not Empty Less than 1 time in 5     Urinate<2 hours after last Less than 1 time in 5     Mult. stop/start when voiding Not at all     Difficult to postpone voiding Less than 1 time in 5     Weak urinary stream Less than 1 time in 5     Push/strain to begin urination Not at all     Times per night up to urinate Less than 1 time in 5           OTHER     Total Score 5              Assessment & Plan:  [name]09/08/22[name] was seen today for follow-up.  Diagnoses and all orders for this visit:  Routine physical examination  Essential (primary) hypertension -     amLODipine (NORVASC) 5 MG tablet; TAKE 1  TABLET BY MOUTH DAILY.  Mixed hyperlipidemia -     pravastatin (PRAVACHOL) 40 MG tablet; Take 1 tablet (40 mg total) by mouth daily.  Prostate cancer screening -     PSA  OSA (obstructive sleep apnea) -     Home sleep test  Snoring -     Home sleep test  Non-restorative sleep -     Home sleep test  Adverse effect of vaccine, subsequent encounter  PAF (paroxysmal atrial fibrillation) (HCC) -     apixaban (ELIQUIS) 5 MG TABS tablet; Take 1 tablet (5 mg total) by mouth 2 (two) times daily. -     atenolol (TENORMIN) 25 MG tablet; Take 1 tablet (25 mg total) by mouth daily.  Atherosclerotic vascular disease -     pravastatin (PRAVACHOL) 40 MG tablet; Take 1 tablet (40 mg total) by mouth daily.  Anxiety and depression  .[name] Discussed 150 minutes of exercise a week.  Encouraged vitamin D 1000 units and Calcium 1300mg  or 4 servings of dairy a day.  Fasting labs done 07/29/20. Discussed in office.  LDL 136. Increased pravachol to 40mg  daily.  PHQ/GAD not to goal.  Declined medication treatment. Discussed some conservative ways to manage mood.  Eliquis refilled for PAF.  Fatigue is ongoing and not sleeping well. Hx of OSA and Not on CPAP. No recent sleep study. Ordered new sleep study.  Colonoscopy UtD.  Needs prostate screening. AUA is 5.  Bp under 150/90.  Covid vaccine UTD.  Declined 2nd shingrix due to hx of vaccine reaction.

## 2020-11-15 LAB — PSA: PSA: 0.86 ng/mL (ref <=4.00)

## 2020-11-17 ENCOUNTER — Telehealth: Payer: Self-pay | Admitting: Neurology

## 2020-11-17 DIAGNOSIS — R0683 Snoring: Secondary | ICD-10-CM

## 2020-11-17 DIAGNOSIS — G478 Other sleep disorders: Secondary | ICD-10-CM

## 2020-11-17 DIAGNOSIS — G4733 Obstructive sleep apnea (adult) (pediatric): Secondary | ICD-10-CM

## 2020-11-17 NOTE — Progress Notes (Signed)
[place],  PSA stable from 2 years ago.

## 2020-11-17 NOTE — Telephone Encounter (Signed)
Signed.

## 2020-11-17 NOTE — Telephone Encounter (Signed)
Kia with [facility] called and [facility] stating home sleep test was denied by insurance. She asked for an in house sleep order. Order pended. Sign if appropriate.

## 2020-12-02 ENCOUNTER — Telehealth: Payer: Self-pay | Admitting: Neurology

## 2020-12-02 NOTE — Telephone Encounter (Signed)
Patient's husband Elease Hashimoto [phone]) left vm asking about patient's sleep study stating they have not heard anything about scheduling. It does look like there is a note from [facility] where they tried to call patient on 11/18/2020 with no answer, vm full. I tried to call Elease Hashimoto back to let her know and no answer, vm not available. To give her the number to schedule directly with them if she calls back. [phone].

## 2021-05-12 IMAGING — CT CT HEART MORP W/ CTA COR W/ SCORE W/ CA W/CM &/OR W/O CM
4 of 7 series · 8 of 20 positions shown, 9 images · IV contrast (omnipaque)
Comparison: none

HISTORY: Chest pain/anginal equiv, ECGs and troponins normal

EXAM:
Cardiac/Coronary CT
TECHNIQUE: The patient was scanned on a Siemens Force scanner.
PROTOCOL: A 120 kV prospective scan was triggered in the descending thoracic
aorta at 111 HU's. Axial non-contrast 3 mm slices were carried out
through the heart. The data set was analyzed on a dedicated work
station and scored using the Agatson method. Gantry rotation speed
was 250 msecs and collimation was 0.6 mm. Beta blockade and 0.8 mg
of sl NTG was given. The 3D data set was reconstructed in 5%
intervals of 35-75% of the R-R cycle. Diastolic phases were analyzed
on a dedicated work station using MPR, MIP and VRT modes. The
patient received 80mL OMNIPAQUE IOHEXOL 350 MG/ML SOLN of contrast.

[Series 8: best diast 68 % · axial · 0.39mm/px · z∈[-97,-54]mm · 2 of 323 slices shown]
[im 108/323  vessel]
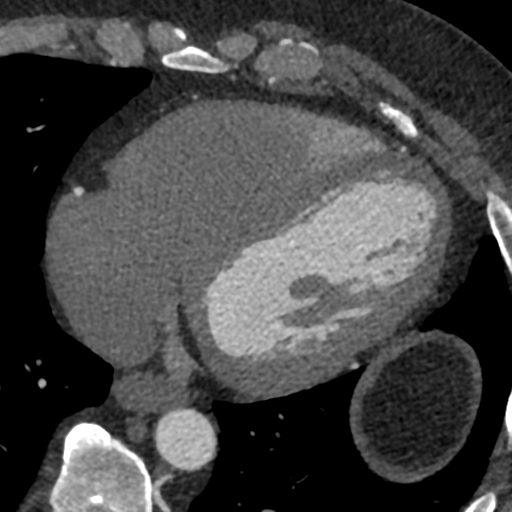
[im 215/323  vessel]
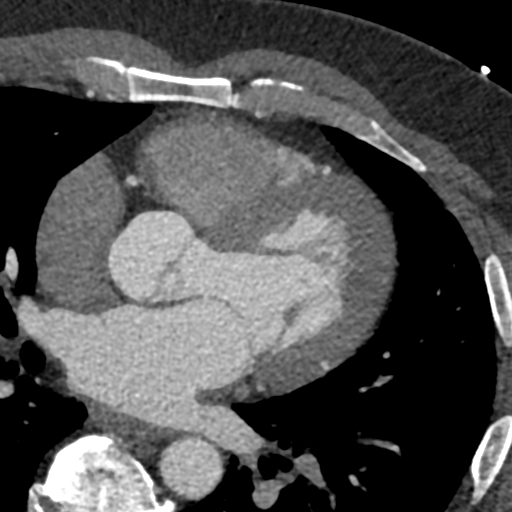

[Series 9: best syst · axial · 0.39mm/px · z∈[-97,-54]mm · 2 of 323 slices shown, 3 images]
[im 108/323  vessel]
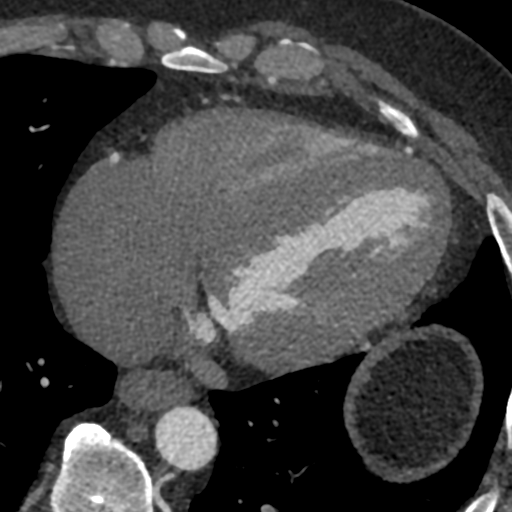
[im 108/323  lung]
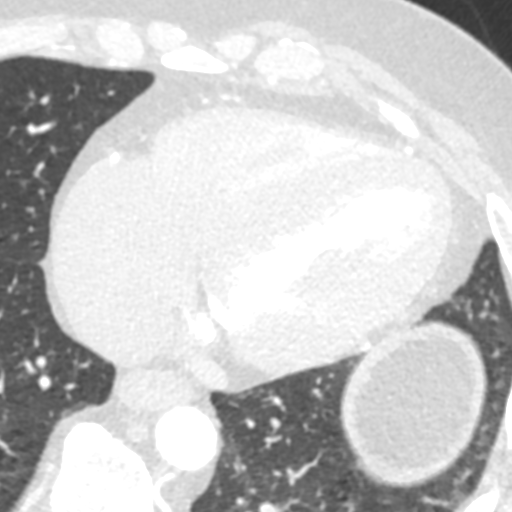
[im 215/323  vessel]
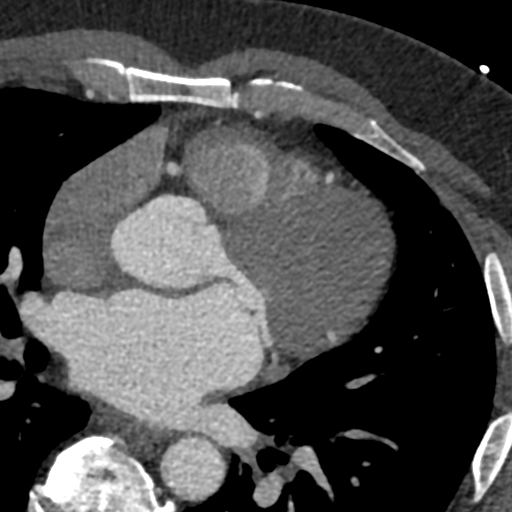

[Series 10: ts diast sharp · axial · 0.39mm/px · z∈[-97,-54]mm · 2 of 323 slices shown]
[im 108/323  lung]
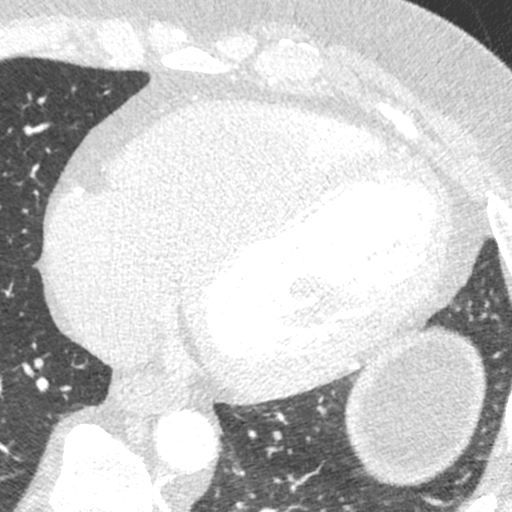
[im 215/323  lung]
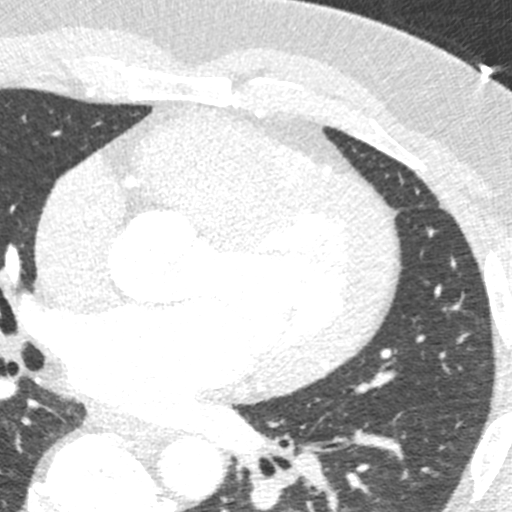

[Series 11: ts syst sharp 33 % · axial · 0.39mm/px · z∈[-97,-54]mm · 2 of 323 slices shown]
[im 108/323  lung]
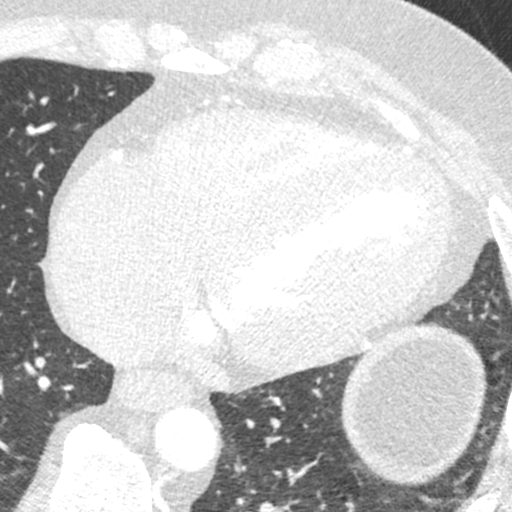
[im 215/323  lung]
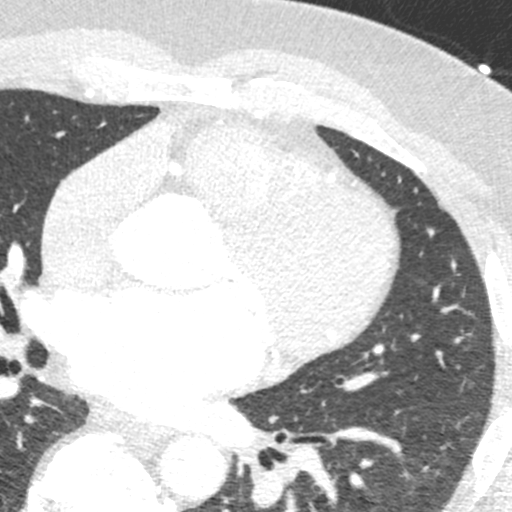

[8 of 20 positions shown; findings below may reference images not displayed]

FINDINGS: Coronary calcium score: The patient's coronary artery calcium score
is 0, which places the patient in the 0 percentile.

Coronary arteries: Normal coronary origins.  Right dominance.

Right Coronary Artery: Normal caliber vessel, gives rise to PDA. No
significant plaque or stenosis.

Left Main Coronary Artery: Normal caliber vessel. No significant
plaque or stenosis.

Left Anterior Descending Coronary Artery: Normal caliber vessel. No
significant plaque or stenosis. Gives rise to 3 diagonal branches.

Left Circumflex Artery: Normal caliber vessel. No significant plaque
or stenosis. Gives rise to large OM1 that is nearly a ramus.

Aorta: Normal size, 37 mm at the mid ascending aorta (level of the
PA bifurcation) measured double oblique. Scattered calcifications
consistent with aortic atherosclerosis. No dissection.

Aortic Valve: No calcifications. Trileaflet.

Other findings:

Normal pulmonary vein drainage into the left atrium.

Normal left atrial appendage without a thrombus.

Normal size of the pulmonary artery.
IMPRESSION: 1. No evidence of CAD, CADRADS = 0.

2. Coronary calcium score of 0. This was 0 percentile for age and
sex matched control.

3. Normal coronary origin with right dominance.

4.  Aortic atherosclerosis

## 2021-08-28 ENCOUNTER — Encounter: Payer: Self-pay | Admitting: Physician Assistant

## 2021-08-28 ENCOUNTER — Ambulatory Visit: Payer: BC Managed Care – PPO | Admitting: Physician Assistant

## 2021-08-28 VITALS — BP 129/84 | HR 80 | Ht 72.0 in | Wt 275.0 lb

## 2021-08-28 DIAGNOSIS — I480 Paroxysmal atrial fibrillation: Secondary | ICD-10-CM

## 2021-08-28 DIAGNOSIS — E782 Mixed hyperlipidemia: Secondary | ICD-10-CM | POA: Diagnosis not present

## 2021-08-28 DIAGNOSIS — Z1329 Encounter for screening for other suspected endocrine disorder: Secondary | ICD-10-CM

## 2021-08-28 DIAGNOSIS — I7090 Unspecified atherosclerosis: Secondary | ICD-10-CM

## 2021-08-28 DIAGNOSIS — Z23 Encounter for immunization: Secondary | ICD-10-CM

## 2021-08-28 DIAGNOSIS — Z125 Encounter for screening for malignant neoplasm of prostate: Secondary | ICD-10-CM | POA: Diagnosis not present

## 2021-08-28 DIAGNOSIS — G4733 Obstructive sleep apnea (adult) (pediatric): Secondary | ICD-10-CM

## 2021-08-28 DIAGNOSIS — R0683 Snoring: Secondary | ICD-10-CM

## 2021-08-28 DIAGNOSIS — Z0000 Encounter for general adult medical examination without abnormal findings: Secondary | ICD-10-CM | POA: Diagnosis not present

## 2021-08-28 DIAGNOSIS — G478 Other sleep disorders: Secondary | ICD-10-CM

## 2021-08-28 DIAGNOSIS — I10 Essential (primary) hypertension: Secondary | ICD-10-CM

## 2021-08-28 HISTORY — DX: Other sleep disorders: G47.8

## 2021-08-28 HISTORY — DX: Snoring: R06.83

## 2021-08-28 MED ORDER — PRAVASTATIN SODIUM 40 MG PO TABS: 40.0000 mg | ORAL_TABLET | Freq: Every day | ORAL | 3 refills | Status: DC

## 2021-08-28 MED ORDER — ATENOLOL 25 MG PO TABS: 25.0000 mg | ORAL_TABLET | Freq: Every day | ORAL | 3 refills | Status: DC

## 2021-08-28 NOTE — Patient Instructions (Addendum)
Reorder sleep study ? ?Health Maintenance, Male ?Adopting a healthy lifestyle and getting preventive care are important in promoting health and wellness. Ask your health care provider about: ?The right schedule for you to have regular tests and exams. ?Things you can do on your own to prevent diseases and keep yourself healthy. ?What should I know about diet, weight, and exercise? ?Eat a healthy diet ? ?Eat a diet that includes plenty of vegetables, fruits, low-fat dairy products, and lean protein. ?Do not eat a lot of foods that are high in solid fats, added sugars, or sodium. ?Maintain a healthy weight ?Body mass index (BMI) is a measurement that can be used to identify possible weight problems. It estimates body fat based on height and weight. Your health care provider can help determine your BMI and help you achieve or maintain a healthy weight. ?Get regular exercise ?Get regular exercise. This is one of the most important things you can do for your health. Most adults should: ?Exercise for at least 150 minutes each week. The exercise should increase your heart rate and make you sweat (moderate-intensity exercise). ?Do strengthening exercises at least twice a week. This is in addition to the moderate-intensity exercise. ?Spend less time sitting. Even light physical activity can be beneficial. ?Watch cholesterol and blood lipids ?Have your blood tested for lipids and cholesterol at 58 years of age, then have this test every 5 years. ?You may need to have your cholesterol levels checked more often if: ?Your lipid or cholesterol levels are high. ?You are older than 58 years of age. ?You are at high risk for heart disease. ?What should I know about cancer screening? ?Many types of cancers can be detected early and may often be prevented. Depending on your health history and family history, you may need to have cancer screening at various ages. This may include screening for: ?Colorectal cancer. ?Prostate  cancer. ?Skin cancer. ?Lung cancer. ?What should I know about heart disease, diabetes, and high blood pressure? ?Blood pressure and heart disease ?High blood pressure causes heart disease and increases the risk of stroke. This is more likely to develop in people who have high blood pressure readings or are overweight. ?Talk with your health care provider about your target blood pressure readings. ?Have your blood pressure checked: ?Every 3-5 years if you are 71-68 years of age. ?Every year if you are 32 years old or older. ?If you are between the ages of 39 and 34 and are a current or former smoker, ask your health care provider if you should have a one-time screening for abdominal aortic aneurysm (AAA). ?Diabetes ?Have regular diabetes screenings. This checks your fasting blood sugar level. Have the screening done: ?Once every three years after age 56 if you are at a normal weight and have a low risk for diabetes. ?More often and at a younger age if you are overweight or have a high risk for diabetes. ?What should I know about preventing infection? ?Hepatitis B ?If you have a higher risk for hepatitis B, you should be screened for this virus. Talk with your health care provider to find out if you are at risk for hepatitis B infection. ?Hepatitis C ?Blood testing is recommended for: ?Everyone born from [birth year] through 1965. ?Anyone with known risk factors for hepatitis C. ?Sexually transmitted infections (STIs) ?You should be screened each year for STIs, including gonorrhea and chlamydia, if: ?You are sexually active and are younger than 58 years of age. ?You are older than 58 years  of age and your health care provider tells you that you are at risk for this type of infection. ?Your sexual activity has changed since you were last screened, and you are at increased risk for chlamydia or gonorrhea. Ask your health care provider if you are at risk. ?Ask your health care provider about whether you are at high risk for HIV.  Your health care provider may recommend a prescription medicine to help prevent HIV infection. If you choose to take medicine to prevent HIV, you should first get tested for HIV. You should then be tested every 3 months for as long as you are taking the medicine. ?Follow these instructions at home: ?Alcohol use ?Do not drink alcohol if your health care provider tells you not to drink. ?If you drink alcohol: ?Limit how much you have to 0-2 drinks a day. ?Know how much alcohol is in your drink. In the [place] one drink equals one 12 oz bottle of beer (355 mL), one 5 oz glass of wine (148 mL), or one 1? oz glass of hard liquor (44 mL). ?Lifestyle ?Do not use any products that contain nicotine or tobacco. These products include cigarettes, chewing tobacco, and vaping devices, such as e-cigarettes. If you need help quitting, ask your health care provider. ?Do not use street drugs. ?Do not share needles. ?Ask your health care provider for help if you need support or information about quitting drugs. ?General instructions ?Schedule regular health, dental, and eye exams. ?Stay current with your vaccines. ?Tell your health care provider if: ?You often feel depressed. ?You have ever been abused or do not feel safe at home. ?Summary ?Adopting a healthy lifestyle and getting preventive care are important in promoting health and wellness. ?Follow your health care provider's instructions about healthy diet, exercising, and getting tested or screened for diseases. ?Follow your health care provider's instructions on monitoring your cholesterol and blood pressure. ?This information is not intended to replace advice given to you by your health care provider. Make sure you discuss any questions you have with your health care provider. ?Document Revised: 09/15/2020 Document Reviewed: 09/15/2020 ?Elsevier Patient Education ? 2023 Elsevier Inc[employer] ? ?

## 2021-08-28 NOTE — Progress Notes (Signed)
? ?Complete physical exam ? ?Patient: Ernest BackerMark S Fuentes   DOB: 02/20/1966   56 y.o. Male  MRN: [id number] ? ?Subjective:  ?  ?Chief Complaint  ?Patient presents with  ? Annual Exam  ? ? ?Ernest Fuentes is a 56 y.o. male who presents today for a complete physical exam. He reports consuming a general diet. The patient does not participate in regular exercise at present. He generally feels well. He reports sleeping fairly well. He does not have additional problems to discuss today.  ? ? ?Most recent fall risk assessment: ? ?  08/28/2021  ?  9:26 AM  ?Fall Risk   ?Falls in the past year? 1  ?Number falls in past yr: 1  ?Injury with Fall? 0  ?Risk for fall due to : History of fall(s)  ?Follow up Falls evaluation completed  ? ?  ?Most recent depression screenings: ? ?  08/28/2021  ?  9:26 AM 11/14/2020  ?  9:03 AM  ?PHQ 2/9 Scores  ?PHQ - 2 Score 0 3  ?PHQ- 9 Score  12  ? ? ?PSA: Prostate cancer screening and PSA options (with potential risks and benefits of testing vs. not testing) were discussed along with recent recs/guidelines.  ? ?Patient Active Problem List  ? Diagnosis Date Noted  ? Immunization reaction 11/14/2020  ? Anxiety and depression 11/14/2020  ? Atherosclerotic vascular disease 07/24/2020  ? Aortic atherosclerosis (HCC) 07/24/2020  ? Chronic sinusitis 07/22/2020  ? Obesity 07/22/2020  ? Reason for consultation 07/22/2020  ? Tobacco use disorder 07/22/2020  ? Benign essential hypertension 07/22/2020  ? Sleep apnea 07/22/2020  ? COPD (chronic obstructive pulmonary disease) (HCC)   ? Hypertension   ? Cardiac murmur 12/07/2019  ? PAF (paroxysmal atrial fibrillation) (HCC) 07/25/2019  ? Bradycardia 07/25/2019  ? Palpitation 07/25/2019  ? Colon polyps 07/25/2019  ? Rash 06/25/2019  ? Urticaria 09/25/2018  ? Allergic drug reaction 09/25/2018  ? Irritant contact dermatitis due to detergent 09/12/2018  ? Family history of prostate cancer 09/11/2018  ? Hyperlipidemia 06/23/2018  ? OSA (obstructive sleep apnea) 06/21/2018  ?  Ganglion cyst of dorsum of right wrist 06/21/2018  ? Essential hypertension 06/21/2018  ? ?Past Medical History:  ?Diagnosis Date  ? Allergic drug reaction 09/25/2018  ? Aortic atherosclerosis (HCC) 07/24/2020  ? Atherosclerotic vascular disease 07/24/2020  ? Benign essential hypertension 07/22/2020  ? Bradycardia 07/25/2019  ? Cardiac murmur 12/07/2019  ? Chronic sinusitis 07/22/2020  ? Colon polyps 07/25/2019  ? Pending pathology.   ? COPD (chronic obstructive pulmonary disease) (HCC)   ? Essential hypertension 06/21/2018  ? Family history of prostate cancer 09/11/2018  ? Ganglion cyst of dorsum of right wrist 06/21/2018  ? Hyperlipidemia 06/23/2018  ? CV risk 19.3 percent 06/2018. Discussed statin.   ? Hypertension   ? Irritant contact dermatitis due to detergent 09/12/2018  ? Obesity 07/22/2020  ? OSA (obstructive sleep apnea) 06/21/2018  ? PAF (paroxysmal atrial fibrillation) (HCC) 07/25/2019  ? Seen on EKG during colonoscopy 07/2019.  ? Palpitation 07/25/2019  ? Rash 06/25/2019  ? Reason for consultation 07/22/2020  ? Sleep apnea 07/22/2020  ? Tobacco use disorder 07/22/2020  ? Jul 29, 2008 Entered By: Halford DecampLUNDY,KATHERINE C Comment: remitting  ? Urticaria 09/25/2018  ? ?Past Surgical History:  ?Procedure Laterality Date  ? NO PAST SURGERIES    ? ?Family Status  ?Relation Name Status  ? Mother  Alive  ? Father  Deceased  ? Sister  Alive  ? Other  (Not  Specified)  ? ?Family History  ?Problem Relation Age of Onset  ? Hypertension Mother   ? Stroke Father   ? Hypertension Father   ? Prostate cancer Father   ? Stroke Sister   ? Hypertension Other   ? Diabetes Other   ? Cancer Other   ? ?Allergies  ?Allergen Reactions  ? Asa [Aspirin] Hives  ? Atorvastatin Rash  ? ?  ? ?Patient Care Team: ?Nolene Ebbs as PCP - General (Family Medicine)  ? ?Outpatient Medications Prior to Visit  ?Medication Sig  ? albuterol (VENTOLIN HFA) 108 (90 Base) MCG/ACT inhaler Inhale 2 puffs into the lungs every 6 (six) hours as needed for wheezing or shortness  of breath.  ? atenolol (TENORMIN) 25 MG tablet Take 1 tablet (25 mg total) by mouth daily.  ? pravastatin (PRAVACHOL) 40 MG tablet Take 1 tablet (40 mg total) by mouth daily.  ? [DISCONTINUED] amLODipine (NORVASC) 5 MG tablet TAKE 1 TABLET BY MOUTH DAILY.  ? [DISCONTINUED] apixaban (ELIQUIS) 5 MG TABS tablet Take 1 tablet (5 mg total) by mouth 2 (two) times daily.  ? ?No facility-administered medications prior to visit.  ? ? ?Review of Systems  ?All other systems reviewed and are negative. ? ? ? ? ?   ?Objective:  ? ?  ?BP (!) 157/72   Pulse 62   Ht 6' (1.829 m)   Wt 275 lb (124.7 kg)   SpO2 96%   BMI 37.30 kg/m?  ?BP Readings from Last 3 Encounters:  ?08/28/21 (!) 157/72  ?11/14/20 (!) 145/67  ?10/18/20 (!) 148/78  ? ?  ? ?Physical Exam  ?BP 129/84   Pulse 80   Ht 6' (1.829 m)   Wt 275 lb (124.7 kg)   SpO2 96%   BMI 37.30 kg/m?  ? ?General Appearance:    Alert, cooperative, no distress, appears stated age  ?Head:    Normocephalic, without obvious abnormality, atraumatic  ?Eyes:    PERRL, conjunctiva/corneas clear, EOM's intact, fundi  ?  benign, both eyes       ?Ears:    Normal TM's and external ear canals, both ears  ?Nose:   Nares normal, septum midline, mucosa normal, no drainage    or sinus tenderness  ?Throat:   Lips, mucosa, and tongue normal; teeth and gums normal  ?Neck:   Supple, symmetrical, trachea midline, no adenopathy;     ?  thyroid:  No enlargement/tenderness/nodules; no carotid ?  bruit or JVD  ?Back:     Symmetric, no curvature, ROM normal, no CVA tenderness  ?Lungs:     Clear to auscultation bilaterally, respirations unlabored  ?Chest wall:    No tenderness or deformity  ?Heart:    Regular rate and rhythm, S1 and S2 normal, 2/6 murmur, rub   or gallop  ?Abdomen:     Soft, non-tender, bowel sounds active all four quadrants,  ?  no masses, no organomegaly  ?   ?   ?Extremities:   Extremities normal, atraumatic, no cyanosis or edema  ?Pulses:   2+ and symmetric all extremities  ?Skin:    Skin color, texture, turgor normal, no rashes or lesions  ?Lymph nodes:   Cervical, supraclavicular, and axillary nodes normal  ?Neurologic:   CNII-XII intact. Normal strength, sensation and reflexes    ?  throughout  ?[name]. ? ?  08/28/2021  ?  9:26 AM 11/14/2020  ?  9:03 AM 06/25/2019  ?  9:19 AM 11/27/2018  ? 10:19 AM 06/21/2018  ?  8:38 AM  ?Depression screen PHQ 2/9  ?Decreased Interest 0 1 0 0 2  ?Down, Depressed, Hopeless 0 2 0 0 0  ?PHQ - 2 Score 0 3 0 0 2  ?Altered sleeping  3 0  0  ?Tired, decreased energy  2 0  0  ?Change in appetite  1 0  0  ?Feeling bad or failure about yourself   1 0  0  ?Trouble concentrating  1 0  0  ?Moving slowly or fidgety/restless  1 0  0  ?Suicidal thoughts  0 0  0  ?PHQ-9 Score  12 0  2  ?Difficult doing work/chores  Somewhat difficult Not difficult at all  Not difficult at all  ? ?.. ? RO-AUA SYMPTOM   ? ? Row Name 08/28/21 0900  ?  ?  ?  ? During the last Month  ? Sensation of Bladder not Empty Less than 1 time in 5    ? Urinate<2 hours after last Less than half the time    ? Mult. stop/start when voiding Less than 1 time in 5    ? Difficult to postpone voiding Less than half the time    ? Weak urinary stream Not at all    ? Push/strain to begin urination Not at all    ? Times per night up to urinate Less than 1 time in 5    ?  ? OTHER  ? Total Score 7    ? ?  ?  ? ?  ? ? ?   ?Assessment & Plan:  ?  ?Routine Health Maintenance and Physical Exam ? ?Immunization History  ?Administered Date(s) Administered  ? PFIZER Comirnaty(Gray Top)Covid-19 Tri-Sucrose Vaccine 10/04/2020  ? PFIZER(Purple Top)SARS-COV-2 Vaccination 09/18/2019, 10/09/2019, 04/09/2020, 10/04/2020  ? Tdap 09/07/2009  ? Zoster Recombinat (Shingrix) 06/21/2018  ? ? ?Health Maintenance  ?Topic Date Due  ? COVID-19 Vaccine (5 - Booster for Pfizer series) 09/13/2021 (Originally 11/29/2020)  ? HIV Screening  11/14/2021 (Originally 03/13/1979)  ? Zoster Vaccines- Shingrix (2 of 2) 11/14/2060 (Originally 08/16/2018)  ? INFLUENZA  VACCINE  12/08/2021  ? TETANUS/TDAP  05/11/2023  ? COLONOSCOPY (Pts 45-47yrs Insurance coverage will need to be confirmed)  07/19/2029  ? Hepatitis C Screening  Completed  ? HPV VACCINES  Aged Out  ? ? ?Discussed health

## 2021-08-29 LAB — COMPLETE METABOLIC PANEL WITH GFR
AG Ratio: 1.3 (calc) (ref 1.0–2.5)
ALT: 11 U/L (ref 9–46)
AST: 20 U/L (ref 10–35)
Albumin: 4 g/dL (ref 3.6–5.1)
Alkaline phosphatase (APISO): 97 U/L (ref 35–144)
BUN: 14 mg/dL (ref 7–25)
CO2: 28 mmol/L (ref 20–32)
Calcium: 9.5 mg/dL (ref 8.6–10.3)
Chloride: 107 mmol/L (ref 98–110)
Creat: 1.07 mg/dL (ref 0.70–1.30)
Globulin: 3 g/dL (calc) (ref 1.9–3.7)
Glucose, Bld: 91 mg/dL (ref 65–99)
Potassium: 4.2 mmol/L (ref 3.5–5.3)
Sodium: 140 mmol/L (ref 135–146)
Total Bilirubin: 1 mg/dL (ref 0.2–1.2)
Total Protein: 7 g/dL (ref 6.1–8.1)
eGFR: 81 mL/min/{1.73_m2} (ref >=60)

## 2021-08-29 LAB — CBC WITH DIFFERENTIAL/PLATELET
Absolute Monocytes: 502 cells/uL (ref 200–950)
Basophils Absolute: 30 cells/uL (ref 0–200)
Basophils Relative: 0.5 %
Eosinophils Absolute: 313 cells/uL (ref 15–500)
Eosinophils Relative: 5.3 %
HCT: 40.8 % (ref 38.5–50.0)
Hemoglobin: 13.9 g/dL (ref 13.2–17.1)
Lymphs Abs: 2071 cells/uL (ref 850–3900)
MCH: 32 pg (ref 27.0–33.0)
MCHC: 34.1 g/dL (ref 32.0–36.0)
MCV: 93.8 fL (ref 80.0–100.0)
MPV: 11.5 fL (ref 7.5–12.5)
Monocytes Relative: 8.5 %
Neutro Abs: 2985 cells/uL (ref 1500–7800)
Neutrophils Relative %: 50.6 %
Platelets: 245 10*3/uL (ref 140–400)
RBC: 4.35 10*6/uL (ref 4.20–5.80)
RDW: 11.4 % (ref 11.0–15.0)
Total Lymphocyte: 35.1 %
WBC: 5.9 10*3/uL (ref 3.8–10.8)

## 2021-08-29 LAB — PSA: PSA: 0.92 ng/mL (ref <=4.00)

## 2021-08-29 LAB — LIPID PANEL W/REFLEX DIRECT LDL
Cholesterol: 156 mg/dL (ref <200)
HDL: 40 mg/dL (ref >=40)
LDL Cholesterol (Calc): 101 mg/dL (calc) — ABNORMAL HIGH
Non-HDL Cholesterol (Calc): 116 mg/dL (calc) (ref <130)
Total CHOL/HDL Ratio: 3.9 (calc) (ref <5.0)
Triglycerides: 67 mg/dL (ref <150)

## 2021-08-29 LAB — TSH: TSH: 1.26 mIU/L (ref 0.40–4.50)

## 2021-08-31 NOTE — Progress Notes (Signed)
Labs look great.

## 2022-03-01 ENCOUNTER — Telehealth: Payer: Self-pay

## 2022-03-01 NOTE — Telephone Encounter (Signed)
Not a provider form, all for patient to complete, given to front desk to contact the patient.

## 2022-03-01 NOTE — Telephone Encounter (Signed)
Patient came into office to drop off physical form for [facility], form placed in box, thanks!

## 2022-03-07 DIAGNOSIS — Z23 Encounter for immunization: Secondary | ICD-10-CM | POA: Diagnosis not present

## 2022-06-24 ENCOUNTER — Other Ambulatory Visit: Payer: Self-pay | Admitting: Physician Assistant

## 2022-06-24 DIAGNOSIS — I7090 Unspecified atherosclerosis: Secondary | ICD-10-CM

## 2022-06-24 DIAGNOSIS — E782 Mixed hyperlipidemia: Secondary | ICD-10-CM

## 2022-08-03 DIAGNOSIS — K5730 Diverticulosis of large intestine without perforation or abscess without bleeding: Secondary | ICD-10-CM | POA: Diagnosis not present

## 2022-08-03 DIAGNOSIS — Z09 Encounter for follow-up examination after completed treatment for conditions other than malignant neoplasm: Secondary | ICD-10-CM | POA: Diagnosis not present

## 2022-08-03 DIAGNOSIS — Z86010 Personal history of colonic polyps: Secondary | ICD-10-CM | POA: Diagnosis not present

## 2022-08-03 DIAGNOSIS — Z1211 Encounter for screening for malignant neoplasm of colon: Secondary | ICD-10-CM | POA: Diagnosis not present

## 2022-08-03 LAB — HM COLONOSCOPY

## 2022-09-01 ENCOUNTER — Encounter: Payer: BC Managed Care – PPO | Admitting: Physician Assistant

## 2022-09-17 ENCOUNTER — Ambulatory Visit (INDEPENDENT_AMBULATORY_CARE_PROVIDER_SITE_OTHER): Payer: BC Managed Care – PPO | Admitting: Physician Assistant

## 2022-09-17 ENCOUNTER — Encounter: Payer: Self-pay | Admitting: Physician Assistant

## 2022-09-17 VITALS — BP 154/73 | HR 54 | Resp 20 | Ht 72.0 in | Wt 261.0 lb

## 2022-09-17 DIAGNOSIS — Z0000 Encounter for general adult medical examination without abnormal findings: Secondary | ICD-10-CM | POA: Diagnosis not present

## 2022-09-17 DIAGNOSIS — Z131 Encounter for screening for diabetes mellitus: Secondary | ICD-10-CM

## 2022-09-17 DIAGNOSIS — Z23 Encounter for immunization: Secondary | ICD-10-CM

## 2022-09-17 DIAGNOSIS — G4733 Obstructive sleep apnea (adult) (pediatric): Secondary | ICD-10-CM

## 2022-09-17 DIAGNOSIS — R0683 Snoring: Secondary | ICD-10-CM | POA: Diagnosis not present

## 2022-09-17 DIAGNOSIS — I10 Essential (primary) hypertension: Secondary | ICD-10-CM

## 2022-09-17 DIAGNOSIS — I480 Paroxysmal atrial fibrillation: Secondary | ICD-10-CM | POA: Diagnosis not present

## 2022-09-17 DIAGNOSIS — G479 Sleep disorder, unspecified: Secondary | ICD-10-CM

## 2022-09-17 DIAGNOSIS — R4589 Other symptoms and signs involving emotional state: Secondary | ICD-10-CM | POA: Insufficient documentation

## 2022-09-17 DIAGNOSIS — G478 Other sleep disorders: Secondary | ICD-10-CM

## 2022-09-17 DIAGNOSIS — Z125 Encounter for screening for malignant neoplasm of prostate: Secondary | ICD-10-CM

## 2022-09-17 DIAGNOSIS — Z13220 Encounter for screening for lipoid disorders: Secondary | ICD-10-CM

## 2022-09-17 HISTORY — DX: Other symptoms and signs involving emotional state: R45.89

## 2022-09-17 MED ORDER — ATENOLOL 25 MG PO TABS: 25.0000 mg | ORAL_TABLET | Freq: Every day | ORAL | 3 refills | Status: DC

## 2022-09-17 NOTE — Patient Instructions (Addendum)
For sleep try:  Valarian root Unisom Calm aid  Health Maintenance, Male Adopting a healthy lifestyle and getting preventive care are important in promoting health and wellness. Ask your health care provider about: The right schedule for you to have regular tests and exams. Things you can do on your own to prevent diseases and keep yourself healthy. What should I know about diet, weight, and exercise? Eat a healthy diet  Eat a diet that includes plenty of vegetables, fruits, low-fat dairy products, and lean protein. Do not eat a lot of foods that are high in solid fats, added sugars, or sodium. Maintain a healthy weight Body mass index (BMI) is a measurement that can be used to identify possible weight problems. It estimates body fat based on height and weight. Your health care provider can help determine your BMI and help you achieve or maintain a healthy weight. Get regular exercise Get regular exercise. This is one of the most important things you can do for your health. Most adults should: Exercise for at least 150 minutes each week. The exercise should increase your heart rate and make you sweat (moderate-intensity exercise). Do strengthening exercises at least twice a week. This is in addition to the moderate-intensity exercise. Spend less time sitting. Even light physical activity can be beneficial. Watch cholesterol and blood lipids Have your blood tested for lipids and cholesterol at 59 years of age, then have this test every 5 years. You may need to have your cholesterol levels checked more often if: Your lipid or cholesterol levels are high. You are older than 59 years of age. You are at high risk for heart disease. What should I know about cancer screening? Many types of cancers can be detected early and may often be prevented. Depending on your health history and family history, you may need to have cancer screening at various ages. This may include screening for: Colorectal  cancer. Prostate cancer. Skin cancer. Lung cancer. What should I know about heart disease, diabetes, and high blood pressure? Blood pressure and heart disease High blood pressure causes heart disease and increases the risk of stroke. This is more likely to develop in people who have high blood pressure readings or are overweight. Talk with your health care provider about your target blood pressure readings. Have your blood pressure checked: Every 3-5 years if you are 69-47 years of age. Every year if you are 5 years old or older. If you are between the ages of 28 and 30 and are a current or former smoker, ask your health care provider if you should have a one-time screening for abdominal aortic aneurysm (AAA). Diabetes Have regular diabetes screenings. This checks your fasting blood sugar level. Have the screening done: Once every three years after age 40 if you are at a normal weight and have a low risk for diabetes. More often and at a younger age if you are overweight or have a high risk for diabetes. What should I know about preventing infection? Hepatitis B If you have a higher risk for hepatitis B, you should be screened for this virus. Talk with your health care provider to find out if you are at risk for hepatitis B infection. Hepatitis C Blood testing is recommended for: Everyone born from [birth year] through 1965. Anyone with known risk factors for hepatitis C. Sexually transmitted infections (STIs) You should be screened each year for STIs, including gonorrhea and chlamydia, if: You are sexually active and are younger than 59 years of age.  You are older than 59 years of age and your health care provider tells you that you are at risk for this type of infection. Your sexual activity has changed since you were last screened, and you are at increased risk for chlamydia or gonorrhea. Ask your health care provider if you are at risk. Ask your health care provider about whether you are at  high risk for HIV. Your health care provider may recommend a prescription medicine to help prevent HIV infection. If you choose to take medicine to prevent HIV, you should first get tested for HIV. You should then be tested every 3 months for as long as you are taking the medicine. Follow these instructions at home: Alcohol use Do not drink alcohol if your health care provider tells you not to drink. If you drink alcohol: Limit how much you have to 0-2 drinks a day. Know how much alcohol is in your drink. In the [place] one drink equals one 12 oz bottle of beer (355 mL), one 5 oz glass of wine (148 mL), or one 1 oz glass of hard liquor (44 mL). Lifestyle Do not use any products that contain nicotine or tobacco. These products include cigarettes, chewing tobacco, and vaping devices, such as e-cigarettes. If you need help quitting, ask your health care provider. Do not use street drugs. Do not share needles. Ask your health care provider for help if you need support or information about quitting drugs. General instructions Schedule regular health, dental, and eye exams. Stay current with your vaccines. Tell your health care provider if: You often feel depressed. You have ever been abused or do not feel safe at home. Summary Adopting a healthy lifestyle and getting preventive care are important in promoting health and wellness. Follow your health care provider's instructions about healthy diet, exercising, and getting tested or screened for diseases. Follow your health care provider's instructions on monitoring your cholesterol and blood pressure. This information is not intended to replace advice given to you by your health care provider. Make sure you discuss any questions you have with your health care provider. Document Revised: 09/15/2020 Document Reviewed: 09/15/2020 Elsevier Patient Education  [place].

## 2022-09-17 NOTE — Progress Notes (Signed)
Complete physical exam  Patient: Ernest Fuentes   DOB: February 09, 1966   57 y.o. Male  MRN: [id number]  Subjective:    No chief complaint on file.   Ernest Fuentes is a 57 y.o. male who presents today for a complete physical exam. He reports consuming a general diet. The patient has a physically strenuous job, but has no regular exercise apart from work.  He generally feels fairly well. He reports sleeping poorly. He has trouble staying asleep. He does snore. He does not wake up feeling rested. He does not have additional problems to discuss today.    Most recent fall risk assessment:    08/28/2021    9:26 AM  Fall Risk   Falls in the past year? 1  Number falls in past yr: 1  Injury with Fall? 0  Risk for fall due to : History of fall(s)  Follow up Falls evaluation completed     Most recent depression screenings:    08/28/2021    9:26 AM 11/14/2020    9:03 AM  PHQ 2/9 Scores  PHQ - 2 Score 0 3  PHQ- 9 Score  12    Vision:Not within last year  and Dental: No current dental problems and No regular dental care   Patient Active Problem List   Diagnosis Date Noted   Snoring 08/28/2021   Non-restorative sleep 08/28/2021   Immunization reaction 11/14/2020   Anxiety and depression 11/14/2020   Atherosclerotic vascular disease 07/24/2020   Aortic atherosclerosis (HCC) 07/24/2020   Chronic sinusitis 07/22/2020   Obesity 07/22/2020   Reason for consultation 07/22/2020   Tobacco use disorder 07/22/2020   Benign essential hypertension 07/22/2020   Sleep apnea 07/22/2020   COPD (chronic obstructive pulmonary disease) (HCC)    Hypertension    Cardiac murmur 12/07/2019   PAF (paroxysmal atrial fibrillation) (HCC) 07/25/2019   Bradycardia 07/25/2019   Palpitation 07/25/2019   Colon polyps 07/25/2019   Rash 06/25/2019   Urticaria 09/25/2018   Allergic drug reaction 09/25/2018   Irritant contact dermatitis due to detergent 09/12/2018   Family history of prostate cancer 09/11/2018    Hyperlipidemia 06/23/2018   OSA (obstructive sleep apnea) 06/21/2018   Ganglion cyst of dorsum of right wrist 06/21/2018   Essential hypertension 06/21/2018   Past Medical History:  Diagnosis Date   Allergic drug reaction 09/25/2018   Aortic atherosclerosis (HCC) 07/24/2020   Atherosclerotic vascular disease 07/24/2020   Benign essential hypertension 07/22/2020   Bradycardia 07/25/2019   Cardiac murmur 12/07/2019   Chronic sinusitis 07/22/2020   Colon polyps 07/25/2019   Pending pathology.    COPD (chronic obstructive pulmonary disease) (HCC)    Essential hypertension 06/21/2018   Family history of prostate cancer 09/11/2018   Ganglion cyst of dorsum of right wrist 06/21/2018   Hyperlipidemia 06/23/2018   CV risk 19.3 percent 06/2018. Discussed statin.    Hypertension    Irritant contact dermatitis due to detergent 09/12/2018   Obesity 07/22/2020   OSA (obstructive sleep apnea) 06/21/2018   PAF (paroxysmal atrial fibrillation) (HCC) 07/25/2019   Seen on EKG during colonoscopy 07/2019.   Palpitation 07/25/2019   Rash 06/25/2019   Reason for consultation 07/22/2020   Sleep apnea 07/22/2020   Tobacco use disorder 07/22/2020   Jul 29, 2008 Entered By: Huston Foley C Comment: remitting   Urticaria 09/25/2018   Family History  Problem Relation Age of Onset   Hypertension Mother    Stroke Father    Hypertension Father  Prostate cancer Father    Stroke Sister    Hypertension Other    Diabetes Other    Cancer Other    Allergies  Allergen Reactions   Asa [Aspirin] Hives   Atorvastatin Rash      Patient Care Team: Nolene Ebbs as PCP - General (Family Medicine)   Outpatient Medications Prior to Visit  Medication Sig   albuterol (VENTOLIN HFA) 108 (90 Base) MCG/ACT inhaler Inhale 2 puffs into the lungs every 6 (six) hours as needed for wheezing or shortness of breath.   atenolol (TENORMIN) 25 MG tablet Take 1 tablet (25 mg total) by mouth daily.   pravastatin (PRAVACHOL) 40 MG  tablet Take 1 tablet (40 mg total) by mouth daily. Appt/labs for refills   No facility-administered medications prior to visit.    ROS       Objective:     There were no vitals taken for this visit. BP Readings from Last 3 Encounters:  08/28/21 129/84  11/14/20 (!) 145/67  10/18/20 (!) 148/78   Wt Readings from Last 3 Encounters:  08/28/21 275 lb (124.7 kg)  11/14/20 281 lb (127.5 kg)  07/24/20 274 lb (124.3 kg)      Physical Exam  BP (!) 154/73 (BP Location: Left Arm, Cuff Size: Large)   Pulse (!) 54   Resp 20   Ht 6' (1.829 m)   Wt 261 lb (118.4 kg)   SpO2 99%   BMI 35.40 kg/m   General Appearance:    Alert, cooperative, no distress, appears stated age  Head:    Normocephalic, without obvious abnormality, atraumatic  Eyes:    PERRL, conjunctiva/corneas clear, EOM's intact, fundi    benign, both eyes       Ears:    Normal TM's and external ear canals, both ears  Nose:   Nares normal, septum midline, mucosa normal, no drainage    or sinus tenderness  Throat:   Lips, mucosa, and tongue normal; teeth and gums normal  Neck:   Supple, symmetrical, trachea midline, no adenopathy;       thyroid:  No enlargement/tenderness/nodules; no carotid   bruit or JVD  Back:     Symmetric, no curvature, ROM normal, no CVA tenderness  Lungs:     Clear to auscultation bilaterally, respirations unlabored  Chest wall:    No tenderness or deformity  Heart:    Regular rate and rhythm, S1 and S2 normal, no murmur, rub   or gallop  Abdomen:     Soft, non-tender, bowel sounds active all four quadrants,    no masses, no organomegaly        Extremities:   Extremities normal, atraumatic, no cyanosis or edema  Pulses:   2+ and symmetric all extremities  Skin:   Skin color, texture, turgor normal, no rashes or lesions  Lymph nodes:   Cervical, supraclavicular, and axillary nodes normal  Neurologic:   CNII-XII intact. Normal strength, sensation and reflexes      throughout       Assessment & Plan:    Routine Health Maintenance and Physical Exam  Immunization History  Administered Date(s) Administered   COVID-19, mRNA, vaccine(Comirnaty)12 years and older 03/07/2022   PFIZER Comirnaty(Gray Top)Covid-19 Tri-Sucrose Vaccine 10/04/2020   PFIZER(Purple Top)SARS-COV-2 Vaccination 09/18/2019, 10/09/2019, 04/09/2020, 10/04/2020   Tdap 09/07/2009   Zoster Recombinat (Shingrix) 06/21/2018, 08/28/2021    Health Maintenance  Topic Date Due   HIV Screening  Never done   DTaP/Tdap/Td (2 - Td or  Tdap) 09/08/2019   INFLUENZA VACCINE  12/09/2022   COLONOSCOPY (Pts 45-68yrs Insurance coverage will need to be confirmed)  08/02/2032   COVID-19 Vaccine  Completed   Hepatitis C Screening  Completed   Zoster Vaccines- Shingrix  Completed   HPV VACCINES  Aged Out    Discussed health benefits of physical activity, and encouraged him to engage in regular exercise appropriate for his age and condition.  [name].Diagnoses and all orders for this visit:  Routine physical examination -     Lipid Panel w/reflex Direct LDL -     COMPLETE METABOLIC PANEL WITH GFR -     PSA -     CBC w/Diff/Platelet -     TSH  PAF (paroxysmal atrial fibrillation) (HCC) -     atenolol (TENORMIN) 25 MG tablet; Take 1 tablet (25 mg total) by mouth daily.  Screening for lipid disorders -     Lipid Panel w/reflex Direct LDL  Screening for diabetes mellitus -     COMPLETE METABOLIC PANEL WITH GFR  Prostate cancer screening -     PSA  Snoring -     Home sleep test  Non-restorative sleep -     Home sleep test  Trouble in sleeping  Depressed mood   .Marland KitchenStart a regular exercise program and make sure you are eating a healthy diet Try to eat 4 servings of dairy a day or take a calcium supplement (500mg  twice a day). Your vaccines are up to date.  Tdap given today Colonoscopy UTD.  PHQ up some but mother has been sick and he has been worried about her. He declines any counseling or medication.   Needs sleep study ordered today BP not to goal. See if CPAP helps.  Fasting labs ordered today.  Needs eye exam and dentist cleaning.  Follow up after CPAP.     Tandy Gaw, PA-C

## 2022-09-18 LAB — COMPLETE METABOLIC PANEL WITH GFR
AG Ratio: 1.1 (calc) (ref 1.0–2.5)
ALT: 12 U/L (ref 9–46)
AST: 22 U/L (ref 10–35)
Albumin: 3.8 g/dL (ref 3.6–5.1)
Alkaline phosphatase (APISO): 100 U/L (ref 35–144)
BUN: 16 mg/dL (ref 7–25)
CO2: 28 mmol/L (ref 20–32)
Calcium: 9.7 mg/dL (ref 8.6–10.3)
Chloride: 105 mmol/L (ref 98–110)
Creat: 1.02 mg/dL (ref 0.70–1.30)
Globulin: 3.4 g/dL (calc) (ref 1.9–3.7)
Glucose, Bld: 93 mg/dL (ref 65–99)
Potassium: 4.4 mmol/L (ref 3.5–5.3)
Sodium: 138 mmol/L (ref 135–146)
Total Bilirubin: 0.8 mg/dL (ref 0.2–1.2)
Total Protein: 7.2 g/dL (ref 6.1–8.1)
eGFR: 85 mL/min/{1.73_m2} (ref >=60)

## 2022-09-18 LAB — CBC WITH DIFFERENTIAL/PLATELET
Absolute Monocytes: 523 cells/uL (ref 200–950)
Basophils Absolute: 32 cells/uL (ref 0–200)
Basophils Relative: 0.5 %
Eosinophils Absolute: 315 cells/uL (ref 15–500)
Eosinophils Relative: 5 %
HCT: 40.7 % (ref 38.5–50.0)
Hemoglobin: 13.4 g/dL (ref 13.2–17.1)
Lymphs Abs: 2249 cells/uL (ref 850–3900)
MCH: 30.8 pg (ref 27.0–33.0)
MCHC: 32.9 g/dL (ref 32.0–36.0)
MCV: 93.6 fL (ref 80.0–100.0)
MPV: 11.3 fL (ref 7.5–12.5)
Monocytes Relative: 8.3 %
Neutro Abs: 3182 cells/uL (ref 1500–7800)
Neutrophils Relative %: 50.5 %
Platelets: 249 10*3/uL (ref 140–400)
RBC: 4.35 10*6/uL (ref 4.20–5.80)
RDW: 11.8 % (ref 11.0–15.0)
Total Lymphocyte: 35.7 %
WBC: 6.3 10*3/uL (ref 3.8–10.8)

## 2022-09-18 LAB — TSH: TSH: 1.84 mIU/L (ref 0.40–4.50)

## 2022-09-18 LAB — LIPID PANEL W/REFLEX DIRECT LDL
Cholesterol: 164 mg/dL (ref <200)
HDL: 43 mg/dL (ref >=40)
LDL Cholesterol (Calc): 104 mg/dL (calc) — ABNORMAL HIGH
Non-HDL Cholesterol (Calc): 121 mg/dL (calc) (ref <130)
Total CHOL/HDL Ratio: 3.8 (calc) (ref <5.0)
Triglycerides: 76 mg/dL (ref <150)

## 2022-09-18 LAB — PSA: PSA: 0.98 ng/mL (ref <=4.00)

## 2022-09-20 ENCOUNTER — Other Ambulatory Visit: Payer: Self-pay | Admitting: Physician Assistant

## 2022-09-20 DIAGNOSIS — E782 Mixed hyperlipidemia: Secondary | ICD-10-CM

## 2022-09-20 DIAGNOSIS — I7090 Unspecified atherosclerosis: Secondary | ICD-10-CM

## 2022-09-20 MED ORDER — PRAVASTATIN SODIUM 40 MG PO TABS: 40.0000 mg | ORAL_TABLET | Freq: Every day | ORAL | 3 refills | Status: DC

## 2022-09-20 NOTE — Progress Notes (Signed)
Beck,   Kidney, liver, glucose look good.  Cholesterol stable. Refilled lovastatin.  PSA stable and normal range.  Thyroid looks good.  Normal WBC and hemoglobin.   Labs look great.

## 2022-11-18 ENCOUNTER — Other Ambulatory Visit: Payer: Self-pay

## 2022-11-18 ENCOUNTER — Ambulatory Visit
Admission: EM | Admit: 2022-11-18 | Discharge: 2022-11-18 | Disposition: A | Discharge status: Other Acute Inpatient Hospital | Payer: BC Managed Care – PPO | Attending: Family Medicine | Admitting: Family Medicine

## 2022-11-18 DIAGNOSIS — L03116 Cellulitis of left lower limb: Secondary | ICD-10-CM | POA: Diagnosis not present

## 2022-11-18 DIAGNOSIS — R519 Headache, unspecified: Secondary | ICD-10-CM | POA: Diagnosis not present

## 2022-11-18 DIAGNOSIS — I10 Essential (primary) hypertension: Secondary | ICD-10-CM

## 2022-11-18 DIAGNOSIS — R21 Rash and other nonspecific skin eruption: Secondary | ICD-10-CM | POA: Diagnosis not present

## 2022-11-18 DIAGNOSIS — R509 Fever, unspecified: Secondary | ICD-10-CM | POA: Diagnosis not present

## 2022-11-18 MED ORDER — ACETAMINOPHEN 500 MG PO TABS
1000.0000 mg | ORAL_TABLET | Freq: Once | ORAL | Status: AC
  Administered 2022-11-18: 1000 mg via ORAL

## 2022-11-18 NOTE — Discharge Instructions (Signed)
Go directly to the ER.

## 2022-11-18 NOTE — ED Provider Notes (Signed)
[facility]    CSN: [id number] Arrival date & time: 11/18/22  1821      History   Chief Complaint Chief Complaint  Patient presents with   Rash   Fever    HPI Ernest Fuentes is a 57 y.o. male.   Generally active brick mason.  Under treatment for hypertension hyperlipidemia and heart disease is here for rash.  He states that yesterday he felt some body aches, joint pain, shaking chills and noticed a small rash on the front of his left shin.  He did not have any trauma or injury.  He states he does do a lot of kneeling which may have rubbed the area.  Today he continues to feel achy and tired, chills.  The rash is much larger.  He states that he is joint pains feel a little better. No recent travel.  No known exposure to illness He is not diabetic.  Has not had skin infections or rashes in the past His appetite is poor.  He is trying to drink enough fluids. He states he was unaware he had a fever until he arrived with a temperature of 101   Today has developed diarrhea.  No abdominal pain nausea or vomiting  Past Medical History:  Diagnosis Date   Allergic drug reaction 09/25/2018   Aortic atherosclerosis (HCC) 07/24/2020   Atherosclerotic vascular disease 07/24/2020   Benign essential hypertension 07/22/2020   Bradycardia 07/25/2019   Cardiac murmur 12/07/2019   Chronic sinusitis 07/22/2020   Colon polyps 07/25/2019   Pending pathology.    COPD (chronic obstructive pulmonary disease) (HCC)    Essential hypertension 06/21/2018   Family history of prostate cancer 09/11/2018   Ganglion cyst of dorsum of right wrist 06/21/2018   Hyperlipidemia 06/23/2018   CV risk 19.3 percent 06/2018. Discussed statin.    Hypertension    Irritant contact dermatitis due to detergent 09/12/2018   Obesity 07/22/2020   OSA (obstructive sleep apnea) 06/21/2018   PAF (paroxysmal atrial fibrillation) (HCC) 07/25/2019   Seen on EKG during colonoscopy 07/2019.   Palpitation 07/25/2019   Rash 06/25/2019    Reason for consultation 07/22/2020   Sleep apnea 07/22/2020   Tobacco use disorder 07/22/2020   Jul 29, 2008 Entered By: Huston Foley C Comment: remitting   Urticaria 09/25/2018    Patient Active Problem List   Diagnosis Date Noted   Depressed mood 09/17/2022   Snoring 08/28/2021   Non-restorative sleep 08/28/2021   Immunization reaction 11/14/2020   Anxiety and depression 11/14/2020   Atherosclerotic vascular disease 07/24/2020   Aortic atherosclerosis (HCC) 07/24/2020   Chronic sinusitis 07/22/2020   Obesity 07/22/2020   Tobacco use disorder 07/22/2020   COPD (chronic obstructive pulmonary disease) (HCC)    Cardiac murmur 12/07/2019   PAF (paroxysmal atrial fibrillation) (HCC) 07/25/2019   Bradycardia 07/25/2019   Colon polyps 07/25/2019   Urticaria 09/25/2018   Allergic drug reaction 09/25/2018   Irritant contact dermatitis due to detergent 09/12/2018   Family history of prostate cancer 09/11/2018   Hyperlipidemia 06/23/2018   OSA (obstructive sleep apnea) 06/21/2018   Ganglion cyst of dorsum of right wrist 06/21/2018   Essential hypertension 06/21/2018    Past Surgical History:  Procedure Laterality Date   NO PAST SURGERIES         Home Medications    Prior to Admission medications   Medication Sig Start Date End Date Taking? Authorizing Provider  albuterol (VENTOLIN HFA) 108 (90 Base) MCG/ACT inhaler Inhale 2 puffs into the  lungs every 6 (six) hours as needed for wheezing or shortness of breath. 03/17/20   Bing Neighbors, NP  atenolol (TENORMIN) 25 MG tablet Take 1 tablet (25 mg total) by mouth daily. 09/17/22   Breeback, [name] L, PA-C  pravastatin (PRAVACHOL) 40 MG tablet Take 1 tablet (40 mg total) by mouth daily. 09/20/22   Jomarie Longs, PA-C    Family History Family History  Problem Relation Age of Onset   Hypertension Mother    Stroke Father    Hypertension Father    Prostate cancer Father    Stroke Sister    Hypertension Other    Diabetes  Other    Cancer Other     Social History Social History   Tobacco Use   Smoking status: Former   Smokeless tobacco: Former  Building services engineer status: Never Used  Substance Use Topics   Alcohol use: Yes    Alcohol/week: 6.0 standard drinks of alcohol    Types: 6 Cans of beer per week   Drug use: Never     Allergies   Asa [aspirin] and Atorvastatin   Review of Systems Review of Systems  See HPI Physical Exam Triage Vital Signs ED Triage Vitals  Encounter Vitals Group     BP 11/18/22 1830 (!) 154/95     Systolic BP Percentile --      Diastolic BP Percentile --      Pulse Rate 11/18/22 1830 (!) 104     Resp 11/18/22 1830 16     Temp 11/18/22 1830 (!) 101 F (38.3 C)     Temp src --      SpO2 11/18/22 1830 98 %     Weight --      Height --      Head Circumference --      Peak Flow --      Pain Score 11/18/22 1829 9     Pain Loc --      Pain Education --      Exclude from Growth Chart --    No data found.  Updated Vital Signs BP (!) 154/95   Pulse (!) 104   Temp (!) 101 F (38.3 C)   Resp 16   SpO2 98%      Physical Exam Constitutional:      General: He is not in acute distress.    Appearance: He is well-developed and normal weight. He is ill-appearing.     Comments: Perspiring, clammy  HENT:     Head: Normocephalic and atraumatic.     Right Ear: Tympanic membrane and ear canal normal.     Left Ear: Tympanic membrane and ear canal normal.     Nose: Nose normal. No congestion.     Mouth/Throat:     Mouth: Mucous membranes are moist.     Pharynx: No posterior oropharyngeal erythema.  Eyes:     Conjunctiva/sclera: Conjunctivae normal.     Pupils: Pupils are equal, round, and reactive to light.  Cardiovascular:     Rate and Rhythm: Normal rate and regular rhythm.     Heart sounds: Normal heart sounds.     Comments: Occasional ectopy Pulmonary:     Effort: Pulmonary effort is normal. No respiratory distress.     Breath sounds: Normal breath  sounds.  Abdominal:     General: There is no distension.     Palpations: Abdomen is soft.     Tenderness: There is no abdominal tenderness.  Musculoskeletal:  General: Normal range of motion.     Cervical back: Normal range of motion.  Lymphadenopathy:     Cervical: No cervical adenopathy.  Skin:    General: Skin is warm and dry.     Findings: Rash present.     Comments: On the front of the left shin there is a serpiginous erythematous soft tissue swollen slightly purpleish.  Pitting edema.  Mildly tender.  Neurological:     General: No focal deficit present.     Mental Status: He is alert.     Gait: Gait normal.      UC Treatments / Results  Labs (all labs ordered are listed, but only abnormal results are displayed) Labs Reviewed - No data to display  EKG   Radiology No results found.  Procedures Procedures (including critical care time)  Medications Ordered in UC Medications  acetaminophen (TYLENOL) tablet 1,000 mg (1,000 mg Oral Given 11/18/22 1836)    Initial Impression / Assessment and Plan / UC Course  I have reviewed the triage vital signs and the nursing notes.  Pertinent labs & imaging results that were available during my care of the patient were reviewed by me and considered in my medical decision making (see chart for details).     This does not appear to be a typical cellulitis.  He has fever, body aches, joint aches, and diarrhea.  I believe he needs a higher level of care.  At the very least he needs IV fluids, IV antibiotics, and laboratory evaluation.  He is agreeable to go to the hospital Final Clinical Impressions(s) / UC Diagnoses   Final diagnoses:  Rash and nonspecific skin eruption  Cellulitis of left lower extremity     Discharge Instructions      Go directly to the ER   ED Prescriptions   None    PDMP not reviewed this encounter.   Eustace Moore, MD 11/18/22 [phone]

## 2022-11-18 NOTE — ED Notes (Signed)
Patient is being discharged from the Urgent Care and sent to the Emergency Department via pov with son . Per nelson md, patient is in need of higher level of care due to fevers, rash, and heart rate variability . Patient is aware and verbalizes understanding of plan of care.   Report called to charge nurse at [facility] valerie RN Vitals:   11/18/22 1830  BP: (!) 154/95  Pulse: (!) 104  Resp: 16  Temp: (!) 101 F (38.3 C)  SpO2: 98%

## 2022-11-18 NOTE — ED Triage Notes (Signed)
Pt presents to uc with co of redness and pain to left shin since yesterday with new onset of fevers and chills.

## 2023-01-26 ENCOUNTER — Other Ambulatory Visit: Payer: Self-pay

## 2023-01-26 NOTE — Progress Notes (Unsigned)
Pharmacy Quality Measure Review  This patient is appearing on a report for being at risk of failing the Controlling Blood Pressure measure this calendar year.   Last documented BP 154/73 on 09/17/22

## 2023-01-28 NOTE — Progress Notes (Signed)
Called spoke with patient he is scheduled for an appointment on Wednesday September 25th to See Tandy Gaw

## 2023-02-02 ENCOUNTER — Encounter: Payer: Self-pay | Admitting: Physician Assistant

## 2023-02-02 ENCOUNTER — Ambulatory Visit: Payer: BC Managed Care – PPO | Admitting: Physician Assistant

## 2023-02-02 VITALS — BP 152/84 | HR 56 | Ht 72.0 in | Wt 262.0 lb

## 2023-02-02 DIAGNOSIS — N522 Drug-induced erectile dysfunction: Secondary | ICD-10-CM

## 2023-02-02 DIAGNOSIS — E782 Mixed hyperlipidemia: Secondary | ICD-10-CM

## 2023-02-02 DIAGNOSIS — N401 Enlarged prostate with lower urinary tract symptoms: Secondary | ICD-10-CM

## 2023-02-02 DIAGNOSIS — R351 Nocturia: Secondary | ICD-10-CM

## 2023-02-02 DIAGNOSIS — I7090 Unspecified atherosclerosis: Secondary | ICD-10-CM

## 2023-02-02 DIAGNOSIS — I480 Paroxysmal atrial fibrillation: Secondary | ICD-10-CM | POA: Diagnosis not present

## 2023-02-02 DIAGNOSIS — I10 Essential (primary) hypertension: Secondary | ICD-10-CM

## 2023-02-02 HISTORY — DX: Benign prostatic hyperplasia with lower urinary tract symptoms: N40.1

## 2023-02-02 HISTORY — DX: Drug-induced erectile dysfunction: N52.2

## 2023-02-02 MED ORDER — TADALAFIL 5 MG PO TABS
5.0000 mg | ORAL_TABLET | Freq: Every day | ORAL | 5 refills | Status: DC
Start: 2023-02-02 — End: 2024-01-04

## 2023-02-02 MED ORDER — AMLODIPINE BESYLATE 2.5 MG PO TABS
2.5000 mg | ORAL_TABLET | Freq: Every day | ORAL | 0 refills | Status: DC
Start: 2023-02-02 — End: 2023-03-03

## 2023-02-02 NOTE — Progress Notes (Unsigned)
Established Patient Office Visit  Subjective   Patient ID: Ernest Fuentes, male    DOB: 1966/04/15  Age: 57 y.o. MRN: [id number]  No chief complaint on file.   HPI Pt is a 57 yo male with PAF and HLD who presents to the clinic for follow up.   He is doing ok. He had ED visit for cellulitis on 7/11 but it has cleared. His heart has stayed in rhythm. He denies any CP, palpitations, headaches or vision changes. He is taking his atenolol.   He has had increasing problems with ED. Seemed to notice after increase in atenolol for HR. He would like to try medication.    .. Active Ambulatory Problems    Diagnosis Date Noted   OSA (obstructive sleep apnea) 06/21/2018   Ganglion cyst of dorsum of right wrist 06/21/2018   Essential hypertension 06/21/2018   Hyperlipidemia 06/23/2018   Family history of prostate cancer 09/11/2018   Irritant contact dermatitis due to detergent 09/12/2018   Urticaria 09/25/2018   Allergic drug reaction 09/25/2018   PAF (paroxysmal atrial fibrillation) (HCC) 07/25/2019   Bradycardia 07/25/2019   Colon polyps 07/25/2019   Cardiac murmur 12/07/2019   COPD (chronic obstructive pulmonary disease) (HCC)    Chronic sinusitis 07/22/2020   Obesity 07/22/2020   Tobacco use disorder 07/22/2020   Atherosclerotic vascular disease 07/24/2020   Aortic atherosclerosis (HCC) 07/24/2020   Immunization reaction 11/14/2020   Anxiety and depression 11/14/2020   Snoring 08/28/2021   Non-restorative sleep 08/28/2021   Depressed mood 09/17/2022   Drug-induced erectile dysfunction 02/02/2023   Benign prostatic hyperplasia with nocturia 02/02/2023   Resolved Ambulatory Problems    Diagnosis Date Noted   Current smoker 06/23/2018   Urinary incontinence 09/11/2018   Rash 06/25/2019   Palpitation 07/25/2019   Hypertension    Reason for consultation 07/22/2020   Benign essential hypertension 07/22/2020   Sleep apnea 07/22/2020   Trouble in sleeping 09/17/2022   No  Additional Past Medical History     ROS See HPI.    Objective:     BP (!) 152/84   Pulse (!) 56   Ht 6' (1.829 m)   Wt 262 lb (118.8 kg)   SpO2 98%   BMI 35.53 kg/m  BP Readings from Last 3 Encounters:  02/02/23 (!) 152/84  11/18/22 (!) 154/95  09/17/22 (!) 154/73   Wt Readings from Last 3 Encounters:  02/02/23 262 lb (118.8 kg)  09/17/22 261 lb (118.4 kg)  08/28/21 275 lb (124.7 kg)    .[name]  SHIM     Row Name 02/02/23 1014         SHIM: Over the last 6 months:   How do you rate your confidence that you could get and keep an erection? Low     When you had erections with sexual stimulation, how often were your erections hard enough for penetration (entering your partner)? A Few Times (much less than half the time)     During sexual intercourse, how often were you able to maintain your erection after you had penetrated (entered) your partner? A Few Times (much less than half the time)     During sexual intercourse, how difficult was it to maintain your erection to completion of intercourse? Difficult     When you attempted sexual intercourse, how often was it satisfactory for you? A Few Times (much less than half the time)       SHIM Total Score   SHIM 11             .[name]  IPSS Questionnaire (AUA-7): Over the past month.   1)  How often have you had a sensation of not emptying your bladder completely after you finish urinating?  1 - Less than 1 time in 5  2)  How often have you had to urinate again less than two hours after you finished urinating? 1 - Less than 1 time in 5  3)  How often have you found you stopped and started again several times when you urinated?  1 - Less than 1 time in 5  4) How difficult have you found it to postpone urination?  3 - About half the time  5) How often have you had a weak urinary stream?  2 - Less than half the time  6) How often have you had to push or strain to begin urination?  2 - Less than half the time  7) How many times did  you most typically get up to urinate from the time you went to bed until the time you got up in the morning?  2 - 2 times  Total score:  0-7 mildly symptomatic   8-19 moderately symptomatic   20-35 severely symptomatic     Physical Exam Constitutional:      Appearance: Normal appearance. He is obese.  HENT:     Head: Normocephalic.  Cardiovascular:     Rate and Rhythm: Normal rate and regular rhythm.     Pulses: Normal pulses.  Pulmonary:     Effort: Pulmonary effort is normal.     Breath sounds: Normal breath sounds.  Musculoskeletal:     Right lower leg: No edema.     Left lower leg: No edema.  Neurological:     General: No focal deficit present.     Mental Status: He is alert and oriented to person, place, and time.  Psychiatric:        Mood and Affect: Mood normal.        The 10-year ASCVD risk score (Arnett DK, et al., 2019) is: 16.6%    Assessment & Plan:  Marland KitchenMarland KitchenDiagnoses and all orders for this visit:  PAF (paroxysmal atrial fibrillation) (HCC)  Atherosclerotic vascular disease  Mixed hyperlipidemia  Drug-induced erectile dysfunction -     tadalafil (CIALIS) 5 MG tablet; Take 1 tablet (5 mg total) by mouth daily.  Benign prostatic hyperplasia with nocturia -     tadalafil (CIALIS) 5 MG tablet; Take 1 tablet (5 mg total) by mouth daily.  Primary hypertension -     amLODipine (NORVASC) 2.5 MG tablet; Take 1 tablet (2.5 mg total) by mouth daily.   Cellulitis resolved and patient doing well  BP not to goal Added norvasc In rhythm continue on atenolol  ED and BPH PSA checked 09/2022 and normal Likely ED due more to BB Start cialis daily  Follow up in 1 month   Return in about 4 weeks (around 03/02/2023), or if symptoms worsen or fail to improve.    Tandy Gaw, PA-C

## 2023-02-02 NOTE — Patient Instructions (Signed)

## 2023-02-03 ENCOUNTER — Encounter: Payer: Self-pay | Admitting: Physician Assistant

## 2023-03-03 ENCOUNTER — Other Ambulatory Visit: Payer: Self-pay | Admitting: Physician Assistant

## 2023-03-03 DIAGNOSIS — I10 Essential (primary) hypertension: Secondary | ICD-10-CM

## 2023-04-18 ENCOUNTER — Ambulatory Visit
Admission: EM | Admit: 2023-04-18 | Discharge: 2023-04-18 | Disposition: A | Discharge status: Home / Self Care | Payer: BC Managed Care – PPO | Attending: Family Medicine | Admitting: Family Medicine

## 2023-04-18 DIAGNOSIS — R059 Cough, unspecified: Secondary | ICD-10-CM

## 2023-04-18 DIAGNOSIS — J069 Acute upper respiratory infection, unspecified: Secondary | ICD-10-CM

## 2023-04-18 MED ORDER — BENZONATATE 200 MG PO CAPS: 200.0000 mg | ORAL_CAPSULE | Freq: Three times a day (TID) | ORAL | 0 refills | Status: AC | PRN

## 2023-04-18 MED ORDER — DOXYCYCLINE HYCLATE 100 MG PO CAPS: 100.0000 mg | ORAL_CAPSULE | Freq: Two times a day (BID) | ORAL | 0 refills | Status: AC

## 2023-04-18 MED ORDER — PREDNISONE 20 MG PO TABS: ORAL_TABLET | ORAL | 0 refills | Status: DC

## 2023-04-18 MED ORDER — HYDROCODONE BIT-HOMATROP MBR 5-1.5 MG/5ML PO SOLN: 5.0000 mL | Freq: Four times a day (QID) | ORAL | 0 refills | Status: DC | PRN

## 2023-04-18 NOTE — ED Provider Notes (Signed)
[facility]    CSN: [id number] Arrival date & time: 04/18/23  0806      History   Chief Complaint Chief Complaint  Patient presents with   Cough    HPI JUNG BUESO is a 57 y.o. male.   HPI 57 year old male presents with cough, chest congestion and wheezing at night for 2 days.  PMH significant for morbid obesity, COPD, PAF, and OSA.  Past Medical History:  Diagnosis Date   Allergic drug reaction 09/25/2018   Aortic atherosclerosis (HCC) 07/24/2020   Atherosclerotic vascular disease 07/24/2020   Benign essential hypertension 07/22/2020   Bradycardia 07/25/2019   Cardiac murmur 12/07/2019   Chronic sinusitis 07/22/2020   Colon polyps 07/25/2019   Pending pathology.    COPD (chronic obstructive pulmonary disease) (HCC)    Essential hypertension 06/21/2018   Family history of prostate cancer 09/11/2018   Ganglion cyst of dorsum of right wrist 06/21/2018   Hyperlipidemia 06/23/2018   CV risk 19.3 percent 06/2018. Discussed statin.    Hypertension    Irritant contact dermatitis due to detergent 09/12/2018   Obesity 07/22/2020   OSA (obstructive sleep apnea) 06/21/2018   PAF (paroxysmal atrial fibrillation) (HCC) 07/25/2019   Seen on EKG during colonoscopy 07/2019.   Palpitation 07/25/2019   Rash 06/25/2019   Reason for consultation 07/22/2020   Sleep apnea 07/22/2020   Tobacco use disorder 07/22/2020   Jul 29, 2008 Entered By: Huston Foley C Comment: remitting   Urticaria 09/25/2018    Patient Active Problem List   Diagnosis Date Noted   Drug-induced erectile dysfunction 02/02/2023   Benign prostatic hyperplasia with nocturia 02/02/2023   Depressed mood 09/17/2022   Snoring 08/28/2021   Non-restorative sleep 08/28/2021   Immunization reaction 11/14/2020   Anxiety and depression 11/14/2020   Atherosclerotic vascular disease 07/24/2020   Aortic atherosclerosis (HCC) 07/24/2020   Chronic sinusitis 07/22/2020   Obesity 07/22/2020   Tobacco use disorder 07/22/2020   COPD  (chronic obstructive pulmonary disease) (HCC)    Cardiac murmur 12/07/2019   PAF (paroxysmal atrial fibrillation) (HCC) 07/25/2019   Bradycardia 07/25/2019   Colon polyps 07/25/2019   Urticaria 09/25/2018   Allergic drug reaction 09/25/2018   Irritant contact dermatitis due to detergent 09/12/2018   Family history of prostate cancer 09/11/2018   Hyperlipidemia 06/23/2018   OSA (obstructive sleep apnea) 06/21/2018   Ganglion cyst of dorsum of right wrist 06/21/2018   Essential hypertension 06/21/2018    Past Surgical History:  Procedure Laterality Date   NO PAST SURGERIES         Home Medications    Prior to Admission medications   Medication Sig Start Date End Date Taking? Authorizing Provider  benzonatate (TESSALON) 200 MG capsule Take 1 capsule (200 mg total) by mouth 3 (three) times daily as needed for up to 7 days. 04/18/23 04/25/23 Yes Trevor Iha, FNP  doxycycline (VIBRAMYCIN) 100 MG capsule Take 1 capsule (100 mg total) by mouth 2 (two) times daily for 7 days. 04/18/23 04/25/23 Yes Trevor Iha, FNP  HYDROcodone bit-homatropine (HYCODAN) 5-1.5 MG/5ML syrup Take 5 mLs by mouth every 6 (six) hours as needed for cough. 04/18/23  Yes Trevor Iha, FNP  predniSONE (DELTASONE) 20 MG tablet Take 3 tabs PO daily x 5 days. 04/18/23  Yes Trevor Iha, FNP  albuterol (VENTOLIN HFA) 108 (90 Base) MCG/ACT inhaler Inhale 2 puffs into the lungs every 6 (six) hours as needed for wheezing or shortness of breath. 03/17/20   Bing Neighbors, NP  amLODipine (  NORVASC) 2.5 MG tablet TAKE 1 TABLET BY MOUTH EVERY DAY 03/03/23   Breeback, [name] L, PA-C  atenolol (TENORMIN) 25 MG tablet Take 1 tablet (25 mg total) by mouth daily. 09/17/22   Breeback, [name] L, PA-C  pravastatin (PRAVACHOL) 40 MG tablet Take 1 tablet (40 mg total) by mouth daily. 09/20/22   Breeback, [name] L, PA-C  tadalafil (CIALIS) 5 MG tablet Take 1 tablet (5 mg total) by mouth daily. 02/02/23   Jomarie Longs, PA-C    Family  History Family History  Problem Relation Age of Onset   Hypertension Mother    Stroke Father    Hypertension Father    Prostate cancer Father    Stroke Sister    Hypertension Other    Diabetes Other    Cancer Other     Social History Social History   Tobacco Use   Smoking status: Former   Smokeless tobacco: Former  Building services engineer status: Never Used  Substance Use Topics   Alcohol use: Yes    Alcohol/week: 6.0 standard drinks of alcohol    Types: 6 Cans of beer per week   Drug use: Never     Allergies   Asa [aspirin] and Atorvastatin   Review of Systems Review of Systems  Respiratory:  Positive for cough, shortness of breath and wheezing.   All other systems reviewed and are negative.    Physical Exam Triage Vital Signs ED Triage Vitals  Encounter Vitals Group     BP 04/18/23 0821 (!) 185/93     Systolic BP Percentile --      Diastolic BP Percentile --      Pulse Rate 04/18/23 0821 75     Resp 04/18/23 0821 17     Temp 04/18/23 0821 98.2 F (36.8 C)     Temp Source 04/18/23 0821 Oral     SpO2 04/18/23 0821 98 %     Weight --      Height --      Head Circumference --      Peak Flow --      Pain Score 04/18/23 0823 0     Pain Loc --      Pain Education --      Exclude from Growth Chart --    No data found.  Updated Vital Signs BP (!) 185/93 (BP Location: Right Arm)   Pulse 75   Temp 98.2 F (36.8 C) (Oral)   Resp 17   SpO2 98%   Physical Exam Vitals and nursing note reviewed.  Constitutional:      General: He is not in acute distress.    Appearance: He is obese. He is not ill-appearing.  HENT:     Head: Normocephalic and atraumatic.     Right Ear: Tympanic membrane and external ear normal.     Left Ear: Tympanic membrane and external ear normal.     Ears:     Comments: Significant eustachian tube dysfunction noted bilaterally    Nose:     Comments: Turbinates are erythematous and edematous    Mouth/Throat:     Mouth: Mucous  membranes are moist.     Pharynx: Oropharynx is clear.  Eyes:     Extraocular Movements: Extraocular movements intact.     Conjunctiva/sclera: Conjunctivae normal.     Pupils: Pupils are equal, round, and reactive to light.  Cardiovascular:     Rate and Rhythm: Normal rate and regular rhythm.     Pulses:  Normal pulses.     Heart sounds: Normal heart sounds. No murmur heard. Pulmonary:     Effort: Pulmonary effort is normal.     Breath sounds: Normal breath sounds. No wheezing, rhonchi or rales.     Comments: Infrequent nonproductive cough on exam Musculoskeletal:        General: Normal range of motion.     Cervical back: Neck supple. No tenderness.  Lymphadenopathy:     Cervical: No cervical adenopathy.  Skin:    General: Skin is warm and dry.  Neurological:     General: No focal deficit present.     Mental Status: He is alert and oriented to person, place, and time. Mental status is at baseline.  Psychiatric:        Mood and Affect: Mood normal.        Behavior: Behavior normal.        Thought Content: Thought content normal.      UC Treatments / Results  Labs (all labs ordered are listed, but only abnormal results are displayed) Labs Reviewed - No data to display  EKG   Radiology No results found.  Procedures Procedures (including critical care time)  Medications Ordered in UC Medications - No data to display  Initial Impression / Assessment and Plan / UC Course  I have reviewed the triage vital signs and the nursing notes.  Pertinent labs & imaging results that were available during my care of the patient were reviewed by me and considered in my medical decision making (see chart for details).     MDM: 1.  Acute URI-Rx'd doxycycline 100 mg capsule: Take 1 capsule twice daily x 7 days; 2.  Cough, unspecified type-Rx prednisone 20 mg tablet: Take 3 tabs p.o. daily x 5 days, Tessalon 200 mg capsule: Take 1 capsule 3 times daily, as needed Hycodan 1.5-5 mg / 5  mL syrup: Take 5 mL every 6 hours, as needed for cough. Advised patient to take medications as directed with food to completion.  Advised patient to take prednisone with first dose of doxycycline for the next 5 of 7 days.  Advised may use Tessalon Perles daily or as needed for cough.  Advised may use Hycodan prior to sleep for cough due to sedative effects.  Encouraged to increase daily water intake to 64 ounces per day while taking these medications.  Advised if symptoms worsen and/or unresolved please follow-up with PCP or here for further evaluation.  Patient discharged home, hemodynamically stable. Final Clinical Impressions(s) / UC Diagnoses   Final diagnoses:  Acute URI  Cough, unspecified type     Discharge Instructions      Advised patient to take medications as directed with food to completion.  Advised patient to take prednisone with first dose of doxycycline for the next 5 of 7 days.  Advised may use Tessalon Perles daily or as needed for cough.  Advised may use Hycodan prior to sleep for cough due to sedative effects.  Encouraged to increase daily water intake to 64 ounces per day while taking these medications.  Advised if symptoms worsen and/or unresolved please follow-up with PCP or here for further evaluation.     ED Prescriptions     Medication Sig Dispense Auth. Provider   doxycycline (VIBRAMYCIN) 100 MG capsule Take 1 capsule (100 mg total) by mouth 2 (two) times daily for 7 days. 14 capsule Trevor Iha, FNP   predniSONE (DELTASONE) 20 MG tablet Take 3 tabs PO daily x 5 days.  15 tablet Trevor Iha, FNP   benzonatate (TESSALON) 200 MG capsule Take 1 capsule (200 mg total) by mouth 3 (three) times daily as needed for up to 7 days. 40 capsule Trevor Iha, FNP   HYDROcodone bit-homatropine (HYCODAN) 5-1.5 MG/5ML syrup Take 5 mLs by mouth every 6 (six) hours as needed for cough. 120 mL Trevor Iha, FNP      I have reviewed the PDMP during this encounter.    Trevor Iha, FNP 04/18/23 [phone]

## 2023-04-18 NOTE — ED Triage Notes (Signed)
Pt c/o cough, chest congestion and wheezing at night x 2 days. Denies fever. Tylenol prn.

## 2023-04-18 NOTE — Discharge Instructions (Addendum)
Advised patient to take medications as directed with food to completion.  Advised patient to take prednisone with first dose of doxycycline for the next 5 of 7 days.  Advised may use Tessalon Perles daily or as needed for cough.  Advised may use Hycodan prior to sleep for cough due to sedative effects.  Encouraged to increase daily water intake to 64 ounces per day while taking these medications.  Advised if symptoms worsen and/or unresolved please follow-up with PCP or here for further evaluation.

## 2023-07-30 ENCOUNTER — Other Ambulatory Visit: Payer: Self-pay

## 2023-07-30 ENCOUNTER — Ambulatory Visit
Admission: RE | Admit: 2023-07-30 | Discharge: 2023-07-30 | Disposition: A | Discharge status: Home / Self Care | Source: Ambulatory Visit | Attending: Family Medicine | Admitting: Family Medicine

## 2023-07-30 VITALS — BP 172/72 | HR 67 | Temp 98.1°F | Resp 16

## 2023-07-30 DIAGNOSIS — J329 Chronic sinusitis, unspecified: Secondary | ICD-10-CM

## 2023-07-30 DIAGNOSIS — R059 Cough, unspecified: Secondary | ICD-10-CM

## 2023-07-30 DIAGNOSIS — J40 Bronchitis, not specified as acute or chronic: Secondary | ICD-10-CM | POA: Diagnosis not present

## 2023-07-30 MED ORDER — BENZONATATE 200 MG PO CAPS: 200.0000 mg | ORAL_CAPSULE | Freq: Three times a day (TID) | ORAL | 0 refills | Status: AC | PRN

## 2023-07-30 MED ORDER — HYDROCODONE BIT-HOMATROP MBR 5-1.5 MG/5ML PO SOLN: 5.0000 mL | Freq: Four times a day (QID) | ORAL | 0 refills | Status: DC | PRN

## 2023-07-30 MED ORDER — AMOXICILLIN-POT CLAVULANATE 875-125 MG PO TABS: 1.0000 | ORAL_TABLET | Freq: Two times a day (BID) | ORAL | 0 refills | Status: DC

## 2023-07-30 MED ORDER — METHYLPREDNISOLONE SODIUM SUCC 125 MG IJ SOLR
125.0000 mg | Freq: Once | INTRAMUSCULAR | Status: AC
  Administered 2023-07-30: 125 mg via INTRAMUSCULAR

## 2023-07-30 MED ORDER — PREDNISONE 10 MG (21) PO TBPK: ORAL_TABLET | Freq: Every day | ORAL | 0 refills | Status: DC

## 2023-07-30 NOTE — ED Triage Notes (Signed)
 Congestion, cough, wheezing when sleeping, lower back pain when coughing x 6 days. Taking sudafed.   Itchy rash under both arms x 2 weeks.

## 2023-07-30 NOTE — ED Provider Notes (Signed)
 [name]    CSN: [id number] Arrival date & time: 07/30/23  1100      History   Chief Complaint Chief Complaint  Patient presents with   Nasal Congestion    Wheezing, coughing, congested, back pain - Entered by patient   Cough    HPI Ernest Fuentes is a 58 y.o. male.   HPI Very pleasant 58 year old male presents with nasal congestion, cough, wheezing x 6 days.  Additionally patient reports back pain and itchy rash under both arms.  PMH significant for tobacco use disorder, morbid obesity, and paroxysmal atrial fibrillation.  Past Medical History:  Diagnosis Date   Allergic drug reaction 09/25/2018   Aortic atherosclerosis (HCC) 07/24/2020   Atherosclerotic vascular disease 07/24/2020   Benign essential hypertension 07/22/2020   Bradycardia 07/25/2019   Cardiac murmur 12/07/2019   Chronic sinusitis 07/22/2020   Colon polyps 07/25/2019   Pending pathology.    COPD (chronic obstructive pulmonary disease) (HCC)    Essential hypertension 06/21/2018   Family history of prostate cancer 09/11/2018   Ganglion cyst of dorsum of right wrist 06/21/2018   Hyperlipidemia 06/23/2018   CV risk 19.3 percent 06/2018. Discussed statin.    Hypertension    Irritant contact dermatitis due to detergent 09/12/2018   Obesity 07/22/2020   OSA (obstructive sleep apnea) 06/21/2018   PAF (paroxysmal atrial fibrillation) (HCC) 07/25/2019   Seen on EKG during colonoscopy 07/2019.   Palpitation 07/25/2019   Rash 06/25/2019   Reason for consultation 07/22/2020   Sleep apnea 07/22/2020   Tobacco use disorder 07/22/2020   Jul 29, 2008 Entered By: Huston Foley C Comment: remitting   Urticaria 09/25/2018    Patient Active Problem List   Diagnosis Date Noted   Drug-induced erectile dysfunction 02/02/2023   Benign prostatic hyperplasia with nocturia 02/02/2023   Depressed mood 09/17/2022   Snoring 08/28/2021   Non-restorative sleep 08/28/2021   Immunization reaction 11/14/2020   Anxiety and depression  11/14/2020   Atherosclerotic vascular disease 07/24/2020   Aortic atherosclerosis (HCC) 07/24/2020   Chronic sinusitis 07/22/2020   Obesity 07/22/2020   Tobacco use disorder 07/22/2020   COPD (chronic obstructive pulmonary disease) (HCC)    Cardiac murmur 12/07/2019   PAF (paroxysmal atrial fibrillation) (HCC) 07/25/2019   Bradycardia 07/25/2019   Colon polyps 07/25/2019   Urticaria 09/25/2018   Allergic drug reaction 09/25/2018   Irritant contact dermatitis due to detergent 09/12/2018   Family history of prostate cancer 09/11/2018   Hyperlipidemia 06/23/2018   OSA (obstructive sleep apnea) 06/21/2018   Ganglion cyst of dorsum of right wrist 06/21/2018   Essential hypertension 06/21/2018    Past Surgical History:  Procedure Laterality Date   NO PAST SURGERIES         Home Medications    Prior to Admission medications   Medication Sig Start Date End Date Taking? Authorizing Provider  amLODipine (NORVASC) 2.5 MG tablet TAKE 1 TABLET BY MOUTH EVERY DAY 03/03/23  Yes Breeback, [name] L, PA-C  amoxicillin-clavulanate (AUGMENTIN) 875-125 MG tablet Take 1 tablet by mouth every 12 (twelve) hours. 07/30/23  Yes Trevor Iha, FNP  atenolol (TENORMIN) 25 MG tablet Take 1 tablet (25 mg total) by mouth daily. 09/17/22  Yes Breeback, [name] L, PA-C  benzonatate (TESSALON) 200 MG capsule Take 1 capsule (200 mg total) by mouth 3 (three) times daily as needed for up to 7 days. 07/30/23 08/06/23 Yes Trevor Iha, FNP  HYDROcodone bit-homatropine (HYCODAN) 5-1.5 MG/5ML syrup Take 5 mLs by mouth every 6 (six) hours  as needed for cough. 07/30/23  Yes Trevor Iha, FNP  predniSONE (STERAPRED UNI-PAK 21 TAB) 10 MG (21) TBPK tablet Take by mouth daily. Take 6 tabs by mouth daily  for 2 days, then 5 tabs for 2 days, then 4 tabs for 2 days, then 3 tabs for 2 days, 2 tabs for 2 days, then 1 tab by mouth daily for 2 days 07/30/23  Yes Trevor Iha, FNP  albuterol (VENTOLIN HFA) 108 (90 Base) MCG/ACT inhaler  Inhale 2 puffs into the lungs every 6 (six) hours as needed for wheezing or shortness of breath. 03/17/20   Bing Neighbors, NP  pravastatin (PRAVACHOL) 40 MG tablet Take 1 tablet (40 mg total) by mouth daily. 09/20/22   Breeback, [name] L, PA-C  tadalafil (CIALIS) 5 MG tablet Take 1 tablet (5 mg total) by mouth daily. 02/02/23   Jomarie Longs, PA-C    Family History Family History  Problem Relation Age of Onset   Hypertension Mother    Stroke Father    Hypertension Father    Prostate cancer Father    Stroke Sister    Hypertension Other    Diabetes Other    Cancer Other     Social History Social History   Tobacco Use   Smoking status: Former   Smokeless tobacco: Former  Building services engineer status: Never Used  Substance Use Topics   Alcohol use: Yes    Alcohol/week: 6.0 standard drinks of alcohol    Types: 6 Cans of beer per week    Comment: 0cc   Drug use: Never     Allergies   Asa [aspirin] and Atorvastatin   Review of Systems Review of Systems  HENT:  Positive for congestion and sinus pressure.   Respiratory:  Positive for cough and wheezing.   All other systems reviewed and are negative.    Physical Exam Triage Vital Signs ED Triage Vitals  Encounter Vitals Group     BP 07/30/23 1129 (!) 172/72     Systolic BP Percentile --      Diastolic BP Percentile --      Pulse Rate 07/30/23 1129 67     Resp 07/30/23 1129 16     Temp 07/30/23 1129 98.1 F (36.7 C)     Temp Source 07/30/23 1129 Oral     SpO2 07/30/23 1129 99 %     Weight --      Height --      Head Circumference --      Peak Flow --      Pain Score 07/30/23 1132 0     Pain Loc --      Pain Education --      Exclude from Growth Chart --    No data found.  Updated Vital Signs BP (!) 172/72 (BP Location: Right Arm)   Pulse 67   Temp 98.1 F (36.7 C) (Oral)   Resp 16   SpO2 99%    Physical Exam Vitals and nursing note reviewed.  Constitutional:      Appearance: Normal appearance.  He is obese. He is ill-appearing.  HENT:     Head: Normocephalic and atraumatic.     Right Ear: Tympanic membrane and external ear normal.     Left Ear: Tympanic membrane and external ear normal.     Ears:     Comments: Moderate eustachian tube dysfunction noted bilaterally    Nose:     Right Sinus: Maxillary sinus tenderness present.  Left Sinus: Maxillary sinus tenderness present.     Comments: Turbinates are erythematous/edematous    Mouth/Throat:     Mouth: Mucous membranes are moist.     Pharynx: Oropharynx is clear.  Eyes:     Extraocular Movements: Extraocular movements intact.     Conjunctiva/sclera: Conjunctivae normal.     Pupils: Pupils are equal, round, and reactive to light.  Cardiovascular:     Rate and Rhythm: Normal rate and regular rhythm.     Pulses: Normal pulses.     Heart sounds: Normal heart sounds.  Pulmonary:     Effort: Pulmonary effort is normal.     Breath sounds: Rhonchi present. No wheezing or rales.     Comments: Very mild diffuse scattered rhonchi noted throughout, infrequent nonproductive cough on exam Musculoskeletal:        General: Normal range of motion.     Cervical back: Normal range of motion and neck supple.  Skin:    General: Skin is warm.  Neurological:     General: No focal deficit present.     Mental Status: He is alert and oriented to person, place, and time.  Psychiatric:        Mood and Affect: Mood normal.        Behavior: Behavior normal.      UC Treatments / Results  Labs (all labs ordered are listed, but only abnormal results are displayed) Labs Reviewed - No data to display  EKG   Radiology No results found.  Procedures Procedures (including critical care time)  Medications Ordered in UC Medications  methylPREDNISolone sodium succinate (SOLU-MEDROL) 125 mg/2 mL injection 125 mg (125 mg Intramuscular Given 07/30/23 1215)    Initial Impression / Assessment and Plan / UC Course  I have reviewed the triage  vital signs and the nursing notes.  Pertinent labs & imaging results that were available during my care of the patient were reviewed by me and considered in my medical decision making (see chart for details).     MDM: 1.  Sinobronchitis-Rx'd Augmentin 875/125 mg tablet: Take 1 tablet twice daily x 7 days, Rx'd Sterapred Unipak (tapering from 60 mg to 10 mg over 10 days), IM Solu-Medrol 125 mg given once in clinic and prior to discharge; 2.  Cough, unspecified type-Rx'd Tessalon 200 mg capsule: Take 1 capsule 3 times daily, as needed for cough, Rx'd Hycodan 5 1.5 mg/5 mL syrup: Take 5 mL every 6 hours, as needed for cough. Advised patient to take medications as directed with food to completion.  Advised patient to take prednisone with first dose of Augmentin to completion.  Advised may take Tessalon capsules daily or as needed for cough.  Advised may take Hycodan cough syrup at night for cough prior to sleep due to sedative effects.  Encouraged increase daily water intake to 64 ounces per day while taking these medications.  Advised if symptoms worsen and/or unresolved please follow-up with your PCP or here for further evaluation.  Patient discharged home, hemodynamically stable. Final Clinical Impressions(s) / UC Diagnoses   Final diagnoses:  Sinobronchitis  Cough, unspecified type     Discharge Instructions      Advised patient to take medications as directed with food to completion.  Advised patient to take prednisone with first dose of Augmentin to completion.  Advised may take Tessalon capsules daily or as needed for cough.  Advised may take Hycodan cough syrup at night for cough prior to sleep due to sedative effects.  Encouraged  increase daily water intake to 64 ounces per day while taking these medications.  Advised if symptoms worsen and/or unresolved please follow-up with your PCP or here for further evaluation.     ED Prescriptions     Medication Sig Dispense Auth. Provider    amoxicillin-clavulanate (AUGMENTIN) 875-125 MG tablet Take 1 tablet by mouth every 12 (twelve) hours. 14 tablet Trevor Iha, FNP   predniSONE (STERAPRED UNI-PAK 21 TAB) 10 MG (21) TBPK tablet Take by mouth daily. Take 6 tabs by mouth daily  for 2 days, then 5 tabs for 2 days, then 4 tabs for 2 days, then 3 tabs for 2 days, 2 tabs for 2 days, then 1 tab by mouth daily for 2 days 42 tablet Trevor Iha, FNP   benzonatate (TESSALON) 200 MG capsule Take 1 capsule (200 mg total) by mouth 3 (three) times daily as needed for up to 7 days. 40 capsule Trevor Iha, FNP   HYDROcodone bit-homatropine (HYCODAN) 5-1.5 MG/5ML syrup Take 5 mLs by mouth every 6 (six) hours as needed for cough. 120 mL Trevor Iha, FNP      I have reviewed the PDMP during this encounter.   Trevor Iha, FNP 07/30/23 1231

## 2023-07-30 NOTE — Discharge Instructions (Addendum)
 Advised patient to take medications as directed with food to completion.  Advised patient to take prednisone with first dose of Augmentin to completion.  Advised may take Tessalon capsules daily or as needed for cough.  Advised may take Hycodan cough syrup at night for cough prior to sleep due to sedative effects.  Encouraged increase daily water intake to 64 ounces per day while taking these medications.  Advised if symptoms worsen and/or unresolved please follow-up with your PCP or here for further evaluation.

## 2023-10-04 ENCOUNTER — Other Ambulatory Visit: Payer: Self-pay | Admitting: Physician Assistant

## 2023-10-04 DIAGNOSIS — I10 Essential (primary) hypertension: Secondary | ICD-10-CM

## 2023-10-05 ENCOUNTER — Other Ambulatory Visit: Payer: Self-pay | Admitting: Physician Assistant

## 2023-10-05 DIAGNOSIS — I7090 Unspecified atherosclerosis: Secondary | ICD-10-CM

## 2023-10-05 DIAGNOSIS — E782 Mixed hyperlipidemia: Secondary | ICD-10-CM

## 2023-11-15 ENCOUNTER — Ambulatory Visit

## 2023-11-15 ENCOUNTER — Ambulatory Visit (INDEPENDENT_AMBULATORY_CARE_PROVIDER_SITE_OTHER): Admitting: Physician Assistant

## 2023-11-15 ENCOUNTER — Other Ambulatory Visit: Payer: Self-pay | Admitting: Physician Assistant

## 2023-11-15 ENCOUNTER — Encounter: Payer: Self-pay | Admitting: Physician Assistant

## 2023-11-15 VITALS — BP 132/85 | HR 89 | Wt 271.0 lb

## 2023-11-15 DIAGNOSIS — I480 Paroxysmal atrial fibrillation: Secondary | ICD-10-CM

## 2023-11-15 DIAGNOSIS — I700 Atherosclerosis of aorta: Secondary | ICD-10-CM

## 2023-11-15 DIAGNOSIS — I7090 Unspecified atherosclerosis: Secondary | ICD-10-CM | POA: Diagnosis not present

## 2023-11-15 DIAGNOSIS — M25561 Pain in right knee: Secondary | ICD-10-CM

## 2023-11-15 DIAGNOSIS — Z0189 Encounter for other specified special examinations: Secondary | ICD-10-CM | POA: Diagnosis not present

## 2023-11-15 DIAGNOSIS — K219 Gastro-esophageal reflux disease without esophagitis: Secondary | ICD-10-CM

## 2023-11-15 DIAGNOSIS — Z0000 Encounter for general adult medical examination without abnormal findings: Secondary | ICD-10-CM

## 2023-11-15 DIAGNOSIS — R351 Nocturia: Secondary | ICD-10-CM

## 2023-11-15 DIAGNOSIS — E782 Mixed hyperlipidemia: Secondary | ICD-10-CM

## 2023-11-15 DIAGNOSIS — G4733 Obstructive sleep apnea (adult) (pediatric): Secondary | ICD-10-CM

## 2023-11-15 DIAGNOSIS — L304 Erythema intertrigo: Secondary | ICD-10-CM

## 2023-11-15 DIAGNOSIS — I10 Essential (primary) hypertension: Secondary | ICD-10-CM | POA: Insufficient documentation

## 2023-11-15 DIAGNOSIS — M545 Low back pain, unspecified: Secondary | ICD-10-CM

## 2023-11-15 DIAGNOSIS — N401 Enlarged prostate with lower urinary tract symptoms: Secondary | ICD-10-CM

## 2023-11-15 HISTORY — DX: Gastro-esophageal reflux disease without esophagitis: K21.9

## 2023-11-15 HISTORY — DX: Pain in right knee: M25.561

## 2023-11-15 HISTORY — DX: Essential (primary) hypertension: I10

## 2023-11-15 HISTORY — DX: Low back pain, unspecified: M54.50

## 2023-11-15 MED ORDER — CLOTRIMAZOLE-BETAMETHASONE 1-0.05 % EX CREA: 1.0000 | TOPICAL_CREAM | Freq: Two times a day (BID) | CUTANEOUS | 1 refills | Status: DC

## 2023-11-15 MED ORDER — ATENOLOL 50 MG PO TABS: 50.0000 mg | ORAL_TABLET | Freq: Every day | ORAL | 0 refills | Status: DC

## 2023-11-15 MED ORDER — OMEPRAZOLE 40 MG PO CPDR: 40.0000 mg | DELAYED_RELEASE_CAPSULE | Freq: Every day | ORAL | 1 refills | Status: DC

## 2023-11-15 MED ORDER — IBUPROFEN 800 MG PO TABS: 800.0000 mg | ORAL_TABLET | Freq: Three times a day (TID) | ORAL | 0 refills | Status: DC | PRN

## 2023-11-15 NOTE — Progress Notes (Addendum)
 Complete physical exam  Patient: Ernest Fuentes   DOB: Apr 11, 1966   58 y.o. Male  MRN: [id number]  Subjective:    No chief complaint on file.   Ernest Fuentes is a 58 y.o. male who presents today for a complete physical exam. He reports consuming a general diet. The patient does not participate in regular exercise at present. He generally feels fairly well. He reports sleeping well. He does have additional problems to discuss today.   Pt has hx of PAF and noticed irregular heart beat since this weekend. Admits to drinking alcohol and not following a diet over the 4th of July. No CP or SOB. No peripheral edema.   He does admit to more GERD symptoms that worsen when he lays down. Not taking anything for this.   He has a rash under both arms that started red and itchy and now less red and itchy.   Right knee pain worsening. No trauma or injury. Taking aleve and tylenol  and does help. Right low back pain intermittently that is worse with twisting. No urinary symptoms, fever, chills,    Most recent fall risk assessment:    11/15/2023    9:02 AM  Fall Risk   Falls in the past year? 0  Number falls in past yr: 0  Injury with Fall? 0  Risk for fall due to : No Fall Risks  Follow up Falls evaluation completed     Most recent depression screenings:    11/15/2023    9:02 AM 09/17/2022    9:38 AM  PHQ 2/9 Scores  PHQ - 2 Score 0 4  PHQ- 9 Score 3 13    Vision:Within last year, Dental: No current dental problems and Receives regular dental care, and PSA: Agrees to PSA testing  Patient Active Problem List   Diagnosis Date Noted   Acute right-sided low back pain without sciatica 11/15/2023   Acute pain of right knee 11/15/2023   Gastroesophageal reflux disease without esophagitis 11/15/2023   Primary hypertension 11/15/2023   Drug-induced erectile dysfunction 02/02/2023   Benign prostatic hyperplasia with nocturia 02/02/2023   Depressed mood 09/17/2022   Snoring 08/28/2021    Non-restorative sleep 08/28/2021   Immunization reaction 11/14/2020   Anxiety and depression 11/14/2020   Atherosclerotic vascular disease 07/24/2020   Aortic atherosclerosis (HCC) 07/24/2020   Chronic sinusitis 07/22/2020   Obesity 07/22/2020   Tobacco use disorder 07/22/2020   COPD (chronic obstructive pulmonary disease) (HCC)    Cardiac murmur 12/07/2019   PAF (paroxysmal atrial fibrillation) (HCC) 07/25/2019   Bradycardia 07/25/2019   Colon polyps 07/25/2019   Urticaria 09/25/2018   Allergic drug reaction 09/25/2018   Irritant contact dermatitis due to detergent 09/12/2018   Family history of prostate cancer 09/11/2018   Hyperlipidemia 06/23/2018   OSA (obstructive sleep apnea) 06/21/2018   Ganglion cyst of dorsum of right wrist 06/21/2018   Essential hypertension 06/21/2018   Past Medical History:  Diagnosis Date   Allergic drug reaction 09/25/2018   Aortic atherosclerosis (HCC) 07/24/2020   Atherosclerotic vascular disease 07/24/2020   Benign essential hypertension 07/22/2020   Bradycardia 07/25/2019   Cardiac murmur 12/07/2019   Chronic sinusitis 07/22/2020   Colon polyps 07/25/2019   Pending pathology.    COPD (chronic obstructive pulmonary disease) (HCC)    Essential hypertension 06/21/2018   Family history of prostate cancer 09/11/2018   Ganglion cyst of dorsum of right wrist 06/21/2018   Hyperlipidemia 06/23/2018   CV risk 19.3 percent 06/2018. Discussed  statin.    Hypertension    Irritant contact dermatitis due to detergent 09/12/2018   Obesity 07/22/2020   OSA (obstructive sleep apnea) 06/21/2018   PAF (paroxysmal atrial fibrillation) (HCC) 07/25/2019   Seen on EKG during colonoscopy 07/2019.   Palpitation 07/25/2019   Rash 06/25/2019   Reason for consultation 07/22/2020   Sleep apnea 07/22/2020   Tobacco use disorder 07/22/2020   Jul 29, 2008 Entered By: GENELL CRANK C Comment: remitting   Urticaria 09/25/2018   Past Surgical History:  Procedure Laterality Date   NO PAST  SURGERIES     Family History  Problem Relation Age of Onset   Hypertension Mother    Stroke Father    Hypertension Father    Prostate cancer Father    Stroke Sister    Hypertension Other    Diabetes Other    Cancer Other    Allergies  Allergen Reactions   Asa [Aspirin] Hives   Atorvastatin  Rash      Patient Care Team: Johnnie Moten L, PA-C as PCP - General (Family Medicine)   Outpatient Medications Prior to Visit  Medication Sig   albuterol  (VENTOLIN  HFA) 108 (90 Base) MCG/ACT inhaler Inhale 2 puffs into the lungs every 6 (six) hours as needed for wheezing or shortness of breath.   amLODipine  (NORVASC ) 2.5 MG tablet TAKE 1 TABLET BY MOUTH EVERY DAY   pravastatin  (PRAVACHOL ) 40 MG tablet TAKE 1 TABLET BY MOUTH EVERY DAY   tadalafil  (CIALIS ) 5 MG tablet Take 1 tablet (5 mg total) by mouth daily.   [DISCONTINUED] amoxicillin -clavulanate (AUGMENTIN ) 875-125 MG tablet Take 1 tablet by mouth every 12 (twelve) hours.   [DISCONTINUED] atenolol  (TENORMIN ) 25 MG tablet Take 1 tablet (25 mg total) by mouth daily.   [DISCONTINUED] HYDROcodone  bit-homatropine (HYCODAN) 5-1.5 MG/5ML syrup Take 5 mLs by mouth every 6 (six) hours as needed for cough.   [DISCONTINUED] predniSONE  (STERAPRED UNI-PAK 21 TAB) 10 MG (21) TBPK tablet Take by mouth daily. Take 6 tabs by mouth daily  for 2 days, then 5 tabs for 2 days, then 4 tabs for 2 days, then 3 tabs for 2 days, 2 tabs for 2 days, then 1 tab by mouth daily for 2 days   No facility-administered medications prior to visit.    ROS  See HPI.       Objective:     BP 132/85   Pulse 89   Wt 271 lb (122.9 kg)   SpO2 99%   BMI 36.75 kg/m  BP Readings from Last 3 Encounters:  11/15/23 132/85  07/30/23 (!) 172/72  04/18/23 (!) 185/93   Wt Readings from Last 3 Encounters:  11/15/23 271 lb (122.9 kg)  02/02/23 262 lb (118.8 kg)  09/17/22 261 lb (118.4 kg)    EKG: irregular rhythm. A.fib.   Physical Exam Constitutional:       Appearance: Normal appearance. He is obese.  HENT:     Head: Normocephalic.     Right Ear: Tympanic membrane, ear canal and external ear normal. There is no impacted cerumen.     Left Ear: Tympanic membrane, ear canal and external ear normal. There is no impacted cerumen.     Nose: Nose normal.     Mouth/Throat:     Mouth: Mucous membranes are moist.     Pharynx: No oropharyngeal exudate or posterior oropharyngeal erythema.  Eyes:     Extraocular Movements: Extraocular movements intact.     Conjunctiva/sclera: Conjunctivae normal.     Pupils: Pupils are  equal, round, and reactive to light.  Neck:     Vascular: No carotid bruit.  Cardiovascular:     Rate and Rhythm: Rhythm irregular.  Pulmonary:     Effort: Pulmonary effort is normal.     Breath sounds: Normal breath sounds.  Abdominal:     General: Bowel sounds are normal. There is no distension.     Palpations: Abdomen is soft.     Tenderness: There is no abdominal tenderness. There is no guarding or rebound.  Musculoskeletal:     Cervical back: Normal range of motion and neck supple. No tenderness.     Right lower leg: No edema.     Left lower leg: No edema.  Lymphadenopathy:     Cervical: No cervical adenopathy.  Neurological:     General: No focal deficit present.     Mental Status: He is alert and oriented to person, place, and time.  Psychiatric:        Mood and Affect: Mood normal.          Assessment & Plan:    Routine Health Maintenance and Physical Exam  Immunization History  Administered Date(s) Administered   PFIZER Comirnaty(Gray Top)Covid-19 Tri-Sucrose Vaccine 10/04/2020   PFIZER(Purple Top)SARS-COV-2 Vaccination 09/18/2019, 10/09/2019, 04/09/2020, 10/04/2020   Pfizer(Comirnaty)Fall Seasonal Vaccine 12 years and older 03/07/2022   Tdap 09/07/2009, 09/17/2022   Zoster Recombinant(Shingrix ) 06/21/2018, 08/28/2021    Health Maintenance  Topic Date Due   HIV Screening  Never done   Hepatitis B  Vaccines (1 of 3 - 19+ 3-dose series) Never done   COVID-19 Vaccine (7 - 2024-25 season) 12/01/2023 (Originally 01/09/2023)   INFLUENZA VACCINE  12/09/2023   Colonoscopy  08/03/2027   DTaP/Tdap/Td (3 - Td or Tdap) 09/16/2032   Hepatitis C Screening  Completed   Zoster Vaccines- Shingrix   Completed   HPV VACCINES  Aged Out   Meningococcal B Vaccine  Aged Out   Pneumococcal Vaccine 3-3 Years old  Discontinued    Discussed health benefits of physical activity, and encouraged him to engage in regular exercise appropriate for his age and condition. SABRA.Diagnoses and all orders for this visit:  Routine physical examination -     CBC with Differential/Platelet -     CMP14+EGFR -     Lipid panel -     PSA, total and free -     TSH -     Cancel: DG Knee 4 Views W/Patella Right; Future -     DG Knee 1-2 Views Left; Future  PAF (paroxysmal atrial fibrillation) (HCC) -     CBC with Differential/Platelet -     TSH -     atenolol  (TENORMIN ) 50 MG tablet; Take 1 tablet (50 mg total) by mouth daily.  Atherosclerotic vascular disease -     Lipid panel  Mixed hyperlipidemia  Benign prostatic hyperplasia with nocturia -     PSA, total and free  Primary hypertension  OSA (obstructive sleep apnea) -     Ambulatory referral to Sleep Studies  Intertrigo -     clotrimazole -betamethasone  (LOTRISONE ) cream; Apply 1 Application topically 2 (two) times daily. Over rash in axilla. -     ibuprofen  (ADVIL ) 800 MG tablet; Take 1 tablet (800 mg total) by mouth every 8 (eight) hours as needed.  Acute pain of right knee -     Cancel: DG Knee 4 Views W/Patella Right; Future -     DG Knee 1-2 Views Left; Future  Acute right-sided low back pain  without sciatica  Aortic atherosclerosis (HCC)  Gastroesophageal reflux disease without esophagitis -     omeprazole  (PRILOSEC) 40 MG capsule; Take 1 capsule (40 mg total) by mouth daily.  SABRA.Start a regular exercise program and make sure you are eating a  healthy diet Try to eat 4 servings of dairy a day or take a calcium  supplement (500mg  twice a day). PHQ no concerns No recent falls Colonoscopy UTD PSA ordered No records of hep B, pt will look for records Pt declined covid vaccine  EKG shows A.fib Will fax to [facility], cardiology and suggested follow up CHA2DS2-VASc score is 2 and not anticoagulated Increased atenolol  to 50mg  daily On pravastatin  Avoid caffeine, alcohol Hx of OSA but reports he did not complete the most recent sleep study Ordered home sleep study, treating OSA could help treat PAF  Trial of omeprazole  for reflux symptoms Take once daily in the morning Discussed GERD diet Follow up in 3 months  Rash appears to be yeast intertrigo Lortisone as needed  Cool compresses for itching Keep areas dry  Right knee pain likely arthritis Xray ordered Ibuprofen  as needed 800mg (no reaction to ibuprofen  OTC) Follow up with Dr. ONEIDA    Return in about 6 months (around 05/17/2024).     Lorianne Malbrough, PA-C

## 2023-11-15 NOTE — Patient Instructions (Addendum)
 Increase atenolol  to 50mg  daily Start prilosec for acid reflux Lortisone cream for rash Start ibuprofen  for knee pain Get xray today of right knee and consider follow up with Dr. ONEIDA.   Health Maintenance, Male Adopting a healthy lifestyle and getting preventive care are important in promoting health and wellness. Ask your health care provider about: The right schedule for you to have regular tests and exams. Things you can do on your own to prevent diseases and keep yourself healthy. What should I know about diet, weight, and exercise? Eat a healthy diet  Eat a diet that includes plenty of vegetables, fruits, low-fat dairy products, and lean protein. Do not eat a lot of foods that are high in solid fats, added sugars, or sodium. Maintain a healthy weight Body mass index (BMI) is a measurement that can be used to identify possible weight problems. It estimates body fat based on height and weight. Your health care provider can help determine your BMI and help you achieve or maintain a healthy weight. Get regular exercise Get regular exercise. This is one of the most important things you can do for your health. Most adults should: Exercise for at least 150 minutes each week. The exercise should increase your heart rate and make you sweat (moderate-intensity exercise). Do strengthening exercises at least twice a week. This is in addition to the moderate-intensity exercise. Spend less time sitting. Even light physical activity can be beneficial. Watch cholesterol and blood lipids Have your blood tested for lipids and cholesterol at 60 years of age, then have this test every 5 years. You may need to have your cholesterol levels checked more often if: Your lipid or cholesterol levels are high. You are older than 60 years of age. You are at high risk for heart disease. What should I know about cancer screening? Many types of cancers can be detected early and may often be prevented. Depending on  your health history and family history, you may need to have cancer screening at various ages. This may include screening for: Colorectal cancer. Prostate cancer. Skin cancer. Lung cancer. What should I know about heart disease, diabetes, and high blood pressure? Blood pressure and heart disease High blood pressure causes heart disease and increases the risk of stroke. This is more likely to develop in people who have high blood pressure readings or are overweight. Talk with your health care provider about your target blood pressure readings. Have your blood pressure checked: Every 3-5 years if you are 28-61 years of age. Every year if you are 59 years old or older. If you are between the ages of 78 and 80 and are a current or former smoker, ask your health care provider if you should have a one-time screening for abdominal aortic aneurysm (AAA). Diabetes Have regular diabetes screenings. This checks your fasting blood sugar level. Have the screening done: Once every three years after age 38 if you are at a normal weight and have a low risk for diabetes. More often and at a younger age if you are overweight or have a high risk for diabetes. What should I know about preventing infection? Hepatitis B If you have a higher risk for hepatitis B, you should be screened for this virus. Talk with your health care provider to find out if you are at risk for hepatitis B infection. Hepatitis C Blood testing is recommended for: Everyone born from [birth year] through 1967. Anyone with known risk factors for hepatitis C. Sexually transmitted infections (STIs) You  should be screened each year for STIs, including gonorrhea and chlamydia, if: You are sexually active and are younger than 60 years of age. You are older than 60 years of age and your health care provider tells you that you are at risk for this type of infection. Your sexual activity has changed since you were last screened, and you are at increased  risk for chlamydia or gonorrhea. Ask your health care provider if you are at risk. Ask your health care provider about whether you are at high risk for HIV. Your health care provider may recommend a prescription medicine to help prevent HIV infection. If you choose to take medicine to prevent HIV, you should first get tested for HIV. You should then be tested every 3 months for as long as you are taking the medicine. Follow these instructions at home: Alcohol use Do not drink alcohol if your health care provider tells you not to drink. If you drink alcohol: Limit how much you have to 0-2 drinks a day. Know how much alcohol is in your drink. In the [place] one drink equals one 12 oz bottle of beer (355 mL), one 5 oz glass of wine (148 mL), or one 1 oz glass of hard liquor (44 mL). Lifestyle Do not use any products that contain nicotine or tobacco. These products include cigarettes, chewing tobacco, and vaping devices, such as e-cigarettes. If you need help quitting, ask your health care provider. Do not use street drugs. Do not share needles. Ask your health care provider for help if you need support or information about quitting drugs. General instructions Schedule regular health, dental, and eye exams. Stay current with your vaccines. Tell your health care provider if: You often feel depressed. You have ever been abused or do not feel safe at home. Summary Adopting a healthy lifestyle and getting preventive care are important in promoting health and wellness. Follow your health care provider's instructions about healthy diet, exercising, and getting tested or screened for diseases. Follow your health care provider's instructions on monitoring your cholesterol and blood pressure. This information is not intended to replace advice given to you by your health care provider. Make sure you discuss any questions you have with your health care provider. Document Revised: 09/15/2020 Document  Reviewed: 09/15/2020 Elsevier Patient Education  2024 [employer].

## 2023-11-15 NOTE — Addendum Note (Signed)
 Addended by: Finley Dinkel P on: 11/15/2023 03:53 PM   Modules accepted: Orders

## 2023-11-16 ENCOUNTER — Ambulatory Visit: Payer: Self-pay | Admitting: Physician Assistant

## 2023-11-16 LAB — CMP14+EGFR
ALT: 13 IU/L (ref 0–44)
AST: 22 IU/L (ref 0–40)
Albumin: 4.2 g/dL (ref 3.8–4.9)
Alkaline Phosphatase: 111 IU/L (ref 44–121)
BUN/Creatinine Ratio: 12 (ref 9–20)
BUN: 12 mg/dL (ref 6–24)
Bilirubin Total: 0.7 mg/dL (ref 0.0–1.2)
CO2: 22 mmol/L (ref 20–29)
Calcium: 9.7 mg/dL (ref 8.7–10.2)
Chloride: 101 mmol/L (ref 96–106)
Creatinine, Ser: 1.03 mg/dL (ref 0.76–1.27)
Globulin, Total: 2.9 g/dL (ref 1.5–4.5)
Glucose: 106 mg/dL — ABNORMAL HIGH (ref 70–99)
Potassium: 4.3 mmol/L (ref 3.5–5.2)
Sodium: 138 mmol/L (ref 134–144)
Total Protein: 7.1 g/dL (ref 6.0–8.5)
eGFR: 84 mL/min/1.73 (ref >59)

## 2023-11-16 LAB — CBC WITH DIFFERENTIAL/PLATELET
Basophils Absolute: 0 x10E3/uL (ref 0.0–0.2)
Basos: 1 %
EOS (ABSOLUTE): 0.2 x10E3/uL (ref 0.0–0.4)
Eos: 4 %
Hematocrit: 44.3 % (ref 37.5–51.0)
Hemoglobin: 14.6 g/dL (ref 13.0–17.7)
Immature Grans (Abs): 0 x10E3/uL (ref 0.0–0.1)
Immature Granulocytes: 0 %
Lymphocytes Absolute: 1.8 x10E3/uL (ref 0.7–3.1)
Lymphs: 35 %
MCH: 32 pg (ref 26.6–33.0)
MCHC: 33 g/dL (ref 31.5–35.7)
MCV: 97 fL (ref 79–97)
Monocytes Absolute: 0.4 x10E3/uL (ref 0.1–0.9)
Monocytes: 8 %
Neutrophils Absolute: 2.8 x10E3/uL (ref 1.4–7.0)
Neutrophils: 52 %
Platelets: 227 x10E3/uL (ref 150–450)
RBC: 4.56 x10E6/uL (ref 4.14–5.80)
RDW: 11.8 % (ref 11.6–15.4)
WBC: 5.3 x10E3/uL (ref 3.4–10.8)

## 2023-11-16 LAB — LIPID PANEL
Chol/HDL Ratio: 3.8 ratio (ref 0.0–5.0)
Cholesterol, Total: 184 mg/dL (ref 100–199)
HDL: 48 mg/dL (ref >39)
LDL Chol Calc (NIH): 111 mg/dL — ABNORMAL HIGH (ref 0–99)
Triglycerides: 144 mg/dL (ref 0–149)
VLDL Cholesterol Cal: 25 mg/dL (ref 5–40)

## 2023-11-16 LAB — TSH: TSH: 1.79 u[IU]/mL (ref 0.450–4.500)

## 2023-11-16 LAB — PSA, TOTAL AND FREE
PSA, Free Pct: 22.9 %
PSA, Free: 0.39 ng/mL
Prostate Specific Ag, Serum: 1.7 ng/mL (ref 0.0–4.0)

## 2023-11-16 MED ORDER — ROSUVASTATIN CALCIUM 10 MG PO TABS: 10.0000 mg | ORAL_TABLET | Freq: Every day | ORAL | 3 refills | Status: DC

## 2023-11-16 NOTE — Progress Notes (Signed)
 Both knees have arthritis in the medial joint space. Treatment plan stays the same. Consider follow up with Dr. ONEIDA for injection consideration in office.

## 2023-11-16 NOTE — Telephone Encounter (Signed)
 Patient informed of results and  A1C added to existing blood in [facility] Patient is agreeable to starting the more intense Statin medication.  Has been taking the Pravastatin  daily without missing any doses

## 2023-11-16 NOTE — Progress Notes (Signed)
 I sent crestor  (rousuvastatin) to pharmacy to replace pravastatin . Has he been called by cardiology yet?

## 2023-11-16 NOTE — Progress Notes (Signed)
 Ernest Fuentes,   Fasting glucose is elevated. We will add A1C to evaluate for diabetes.  Prostate enzyme normal.  Thyroid looks good.   Cholesterol not to goal. Have you been taking pravastatin ? Would you be ok with us  trying a more intense statin to get you to goal? If you have not been taking daily we could see what regular taking does to LDL.   Ernest Fuentes.The 10-year ASCVD risk score (Arnett DK, et al., 2019) is: 13.5%   Values used to calculate the score:     Age: 58 years     Clincally relevant sex: Male     Is Non-Hispanic African American: Yes     Diabetic: No     Tobacco smoker: No     Systolic Blood Pressure: 132 mmHg     Is BP treated: Yes     HDL Cholesterol: 48 mg/dL     Total Cholesterol: 184 mg/dL

## 2023-11-17 NOTE — Telephone Encounter (Signed)
 Attempted call to patient. Left a voice mail message requesting a return call.

## 2023-11-18 LAB — SPECIMEN STATUS REPORT

## 2023-11-18 LAB — HGB A1C W/O EAG: Hgb A1c MFr Bld: 4.7 % — ABNORMAL LOW (ref 4.8–5.6)

## 2023-11-23 NOTE — Addendum Note (Signed)
 Addended by: BONNY JON DEL on: 11/23/2023 02:26 PM   Modules accepted: Orders

## 2023-12-12 ENCOUNTER — Other Ambulatory Visit: Payer: Self-pay | Admitting: Physician Assistant

## 2023-12-12 DIAGNOSIS — L304 Erythema intertrigo: Secondary | ICD-10-CM

## 2024-01-04 ENCOUNTER — Encounter: Payer: Self-pay | Admitting: Cardiology

## 2024-01-04 ENCOUNTER — Ambulatory Visit: Attending: Cardiology

## 2024-01-04 ENCOUNTER — Telehealth: Payer: Self-pay | Admitting: Cardiology

## 2024-01-04 ENCOUNTER — Ambulatory Visit: Attending: Cardiology | Admitting: Cardiology

## 2024-01-04 VITALS — BP 136/80 | HR 56 | Ht 72.0 in | Wt 270.0 lb

## 2024-01-04 DIAGNOSIS — I480 Paroxysmal atrial fibrillation: Secondary | ICD-10-CM

## 2024-01-04 DIAGNOSIS — E782 Mixed hyperlipidemia: Secondary | ICD-10-CM | POA: Diagnosis not present

## 2024-01-04 DIAGNOSIS — I10 Essential (primary) hypertension: Secondary | ICD-10-CM

## 2024-01-04 DIAGNOSIS — I700 Atherosclerosis of aorta: Secondary | ICD-10-CM | POA: Diagnosis not present

## 2024-01-04 DIAGNOSIS — R002 Palpitations: Secondary | ICD-10-CM

## 2024-01-04 MED ORDER — APIXABAN 5 MG PO TABS: 5.0000 mg | ORAL_TABLET | Freq: Two times a day (BID) | ORAL | 3 refills | Status: AC

## 2024-01-04 NOTE — Telephone Encounter (Signed)
 Call disconnected while Fairy was holding. Please return call as soon as possible.

## 2024-01-04 NOTE — Patient Instructions (Signed)
 Medication Instructions:  Your physician recommends that you continue on your current medications as directed. Please refer to the Current Medication list given to you today.  *If you need a refill on your cardiac medications before your next appointment, please call your pharmacy*   Lab Work: None ordered If you have labs (blood work) drawn today and your tests are completely normal, you will receive your results only by: MyChart Message (if you have MyChart) OR A paper copy in the mail If you have any lab test that is abnormal or we need to change your treatment, we will call you to review the results.  Testing/Procedures: Your physician has requested that you have an echocardiogram. Echocardiography is a painless test that uses sound waves to create images of your heart. It provides your doctor with information about the size and shape of your heart and how well your heart's chambers and valves are working. This procedure takes approximately one hour. There are no restrictions for this procedure. Please do NOT wear cologne, perfume, aftershave, or lotions (deodorant is allowed). Please arrive 15 minutes prior to your appointment time.  Please note: We ask at that you not bring children with you during ultrasound (echo/ vascular) testing. Due to room size and safety concerns, children are not allowed in the ultrasound rooms during exams. Our front office staff cannot provide observation of children in our lobby area while testing is being conducted. An adult accompanying a patient to their appointment will only be allowed in the ultrasound room at the discretion of the ultrasound technician under special circumstances. We apologize for any inconvenience.  Your physician has recommended that you wear an event monitor. Event monitors are medical devices that record the heart's electrical activity. Doctors most often us  these monitors to diagnose arrhythmias. Arrhythmias are problems with the  speed or rhythm of the heartbeat. The monitor is a small, portable device. You can wear one while you do your normal daily activities. This is usually used to diagnose what is causing palpitations/syncope (passing out). This will be a 30 day monitor.  Your physician has recommended that you have a sleep study. This test records several body functions during sleep, including: brain activity, eye movement, oxygen and carbon dioxide blood levels, heart rate and rhythm, breathing rate and rhythm, the flow of air through your mouth and nose, snoring, body muscle movements, and chest and belly movement. This device will be mailed to your home.  Follow-Up: At [facility], you and your health needs are our priority.  As part of our continuing mission to provide you with exceptional heart care, we have created designated Provider Care Teams.  These Care Teams include your primary Cardiologist (physician) and Advanced Practice Providers (APPs -  Physician Assistants and Nurse Practitioners) who all work together to provide you with the care you need, when you need it.  We recommend signing up for the patient portal called MyChart.  Sign up information is provided on this After Visit Summary.  MyChart is used to connect with patients for Virtual Visits (Telemedicine).  Patients are able to view lab/test results, encounter notes, upcoming appointments, etc.  Non-urgent messages can be sent to your provider as well.   To learn more about what you can do with MyChart, go to [email or web address].    Your next appointment:   9 month(s)  The format for your next appointment:   In Person  Provider:   Jennifer Crape, MD   Other Instructions Echocardiogram An echocardiogram is  a test that uses sound waves (ultrasound) to produce images of the heart. Images from an echocardiogram can provide important information about: Heart size and shape. The size and thickness and movement of your heart's  walls. Heart muscle function and strength. Heart valve function or if you have stenosis. Stenosis is when the heart valves are too narrow. If blood is flowing backward through the heart valves (regurgitation). A tumor or infectious growth around the heart valves. Areas of heart muscle that are not working well because of poor blood flow or injury from a heart attack. Aneurysm detection. An aneurysm is a weak or damaged part of an artery wall. The wall bulges out from the normal force of blood pumping through the body. Tell a health care provider about: Any allergies you have. All medicines you are taking, including vitamins, herbs, eye drops, creams, and over-the-counter medicines. Any blood disorders you have. Any surgeries you have had. Any medical conditions you have. Whether you are pregnant or may be pregnant. What are the risks? Generally, this is a safe test. However, problems may occur, including an allergic reaction to dye (contrast) that may be used during the test. What happens before the test? No specific preparation is needed. You may eat and drink normally. What happens during the test? You will take off your clothes from the waist up and put on a hospital gown. Electrodes or electrocardiogram (ECG)patches may be placed on your chest. The electrodes or patches are then connected to a device that monitors your heart rate and rhythm. You will lie down on a table for an ultrasound exam. A gel will be applied to your chest to help sound waves pass through your skin. A handheld device, called a transducer, will be pressed against your chest and moved over your heart. The transducer produces sound waves that travel to your heart and bounce back (or echo back) to the transducer. These sound waves will be captured in real-time and changed into images of your heart that can be viewed on a video monitor. The images will be recorded on a computer and reviewed by your health care  provider. You may be asked to change positions or hold your breath for a short time. This makes it easier to get different views or better views of your heart. In some cases, you may receive contrast through an IV in one of your veins. This can improve the quality of the pictures from your heart. The procedure may vary among health care providers and hospitals.   What can I expect after the test? You may return to your normal, everyday life, including diet, activities, and medicines, unless your health care provider tells you not to do that. Follow these instructions at home: It is up to you to get the results of your test. Ask your health care provider, or the department that is doing the test, when your results will be ready. Keep all follow-up visits. This is important. Summary An echocardiogram is a test that uses sound waves (ultrasound) to produce images of the heart. Images from an echocardiogram can provide important information about the size and shape of your heart, heart muscle function, heart valve function, and other possible heart problems. You do not need to do anything to prepare before this test. You may eat and drink normally. After the echocardiogram is completed, you may return to your normal, everyday life, unless your health care provider tells you not to do that. This information is not intended to  replace advice given to you by your health care provider. Make sure you discuss any questions you have with your health care provider. Document Revised: 12/18/2019 Document Reviewed: 12/18/2019 Elsevier Patient Education  2021 Elsevier Inc.   Important Information About Sugar

## 2024-01-04 NOTE — Telephone Encounter (Signed)
 [employer] reports that the pt had 7 mins of afib rate of 123 BPM. Pt had PVC's noted. Report printed.

## 2024-01-04 NOTE — Progress Notes (Signed)
 Cardiology Office Note:    Date:  01/04/2024   ID:  CRUISE BAUMGARDNER, DOB 08-14-1965, MRN [id number]  PCP:  Antoniette Vermell CROME, PA-C  Cardiologist:  Jennifer JONELLE Crape, MD   Referring MD: Antoniette Vermell CROME, PA-C    ASSESSMENT:    1. Mixed hyperlipidemia   2. Aortic atherosclerosis (HCC)   3. Essential hypertension   4. PAF (paroxysmal atrial fibrillation) (HCC)   5. Palpitations    PLAN:    In order of problems listed above:  Primary prevention stressed with the patient.  Importance of compliance with diet medication stressed and patient verbalized standing. He was advised to walk at least half an hour a day on a daily basis. Paroxysmal atrial fibrillation: He was told in the past the need and importance of anticoagulation.  He decided not to pursue this.  He still is not committed to anticoagulation.  Benefits and potential risks explained.  Watchman device explained.  He wants to think about this.  In the interim, I will do a ZIO monitoring.  Currently is in sinus rhythm. Essential hypertension: Blood pressure stable.  Diet emphasized.  Lifestyle modification urged.  When he left his blood pressure was 136/80 and we will check this again. Mixed dyslipidemia: On lipid-lowering medications.  Followed by primary care.  Not at goal.  Diet emphasized.  Goal LDL should be less than 60. History of sleep apnea: Will do a sleep study with an sleep specialist.  He tells me the study was done 10 years ago and was positive.  He has history of snoring.  Will order study with [facility]. Cardiac murmur: Echocardiogram will be done to assess murmur heard on auscultation. Obesity: Weight reduction stressed diet emphasized and he promises to do better.  He was advised to walk at least half an hour a day on a daily basis. Patient will be seen in follow-up appointment in 6 months or earlier if the patient has any concerns.    Medication Adjustments/Labs and Tests Ordered: Current medicines are reviewed at  length with the patient today.  Concerns regarding medicines are outlined above.  Orders Placed This Encounter  Procedures   Cardiac event monitor   EKG 12-Lead   ECHOCARDIOGRAM COMPLETE   [facility] Sleep Study   No orders of the defined types were placed in this encounter.    History of Present Illness:    Ernest Fuentes is a 58 y.o. male who is being seen today for the evaluation of history of paroxysmal atrial fibrillation at the request of Antoniette Vermell CROME, PA-C.  Patient is a pleasant 58 year old male.  He has past medical history of essential hypertension, paroxysmal atrial fibrillation, mixed dyslipidemia, aortic atherosclerosis, sleep apnea and snoring.  He is a Corporate investment banker.  He does not exercise on a regular basis.  No chest pain orthopnea or PND.  He occasionally has palpitations.  At the time of my evaluation, the patient is alert awake oriented and in no distress.  No dizziness or syncope.  Past Medical History:  Diagnosis Date   Acute pain of right knee 11/15/2023   Acute right-sided low back pain without sciatica 11/15/2023   Allergic drug reaction 09/25/2018   Anxiety and depression 11/14/2020   Aortic atherosclerosis (HCC) 07/24/2020   Atherosclerotic vascular disease 07/24/2020   Benign prostatic hyperplasia with nocturia 02/02/2023   Bradycardia 07/25/2019   Cardiac murmur 12/07/2019   Chronic sinusitis 07/22/2020   Colon polyps 07/25/2019   Pending pathology.    COPD (  chronic obstructive pulmonary disease) (HCC)    Depressed mood 09/17/2022   Drug-induced erectile dysfunction 02/02/2023   Essential hypertension 06/21/2018   Family history of prostate cancer 09/11/2018   Ganglion cyst of dorsum of right wrist 06/21/2018   Gastroesophageal reflux disease without esophagitis 11/15/2023   Hyperlipidemia 06/23/2018   CV risk 19.3 percent 06/2018. Discussed statin.    Immunization reaction 11/14/2020   To shingrix . Declines any further dosages.      Irritant  contact dermatitis due to detergent 09/12/2018   Non-restorative sleep 08/28/2021   Obesity 07/22/2020   OSA (obstructive sleep apnea) 06/21/2018   PAF (paroxysmal atrial fibrillation) (HCC) 07/25/2019   Seen on EKG during colonoscopy 07/2019.   Primary hypertension 11/15/2023   Snoring 08/28/2021   Tobacco use disorder 07/22/2020   Jul 29, 2008 Entered By: GENELL CRANK C Comment: remitting   Urticaria 09/25/2018    Past Surgical History:  Procedure Laterality Date   NO PAST SURGERIES      Current Medications: Current Meds  Medication Sig   pravastatin  (PRAVACHOL ) 40 MG tablet Take 40 mg by mouth daily.     Allergies:   Asa [aspirin] and Atorvastatin    Social History   Socioeconomic History   Marital status: Single    Spouse name: Not on file   Number of children: Not on file   Years of education: Not on file   Highest education level: Not on file  Occupational History   Not on file  Tobacco Use   Smoking status: Former   Smokeless tobacco: Former  Advertising account planner   Vaping status: Never Used  Substance and Sexual Activity   Alcohol use: Yes    Alcohol/week: 6.0 standard drinks of alcohol    Types: 6 Cans of beer per week    Comment: 0cc   Drug use: Never   Sexual activity: Yes  Other Topics Concern   Not on file  Social History Narrative   Not on file   Social Drivers of Health   Financial Resource Strain: Not on file  Food Insecurity: Not on file  Transportation Needs: Not on file  Physical Activity: Not on file  Stress: Not on file  Social Connections: Unknown (09/22/2021)   Received from [employer]   Social Network    Social Network: Not on file     Family History: The patient's family history includes Cancer in an other family member; Diabetes in an other family member; Hypertension in his father, mother, and another family member; Prostate cancer in his father; Stroke in his father and sister.  ROS:   Please see the history of present illness.     All other systems reviewed and are negative.  EKGs/Labs/Other Studies Reviewed:    The following studies were reviewed today:  EKG Interpretation Date/Time:  Wednesday January 04 2024 08:56:15 EDT Ventricular Rate:  56 PR Interval:  168 QRS Duration:  84 QT Interval:  432 QTC Calculation: 416 R Axis:   15  Text Interpretation: Sinus bradycardia Nonspecific T wave abnormality No previous ECGs available Confirmed by Edwyna Backers [phone]) on 01/04/2024 9:04:58 AM     Recent Labs: 11/15/2023: ALT 13; BUN 12; Creatinine, Ser 1.03; Hemoglobin 14.6; Platelets 227; Potassium 4.3; Sodium 138; TSH 1.790  Recent Lipid Panel    Component Value Date/Time   CHOL 184 11/15/2023 0932   TRIG 144 11/15/2023 0932   HDL 48 11/15/2023 0932   CHOLHDL 3.8 11/15/2023 0932   CHOLHDL 3.8 09/17/2022 0849   LDLCALC  111 (H) 11/15/2023 0932   LDLCALC 104 (H) 09/17/2022 0849    Physical Exam:    VS:  BP (!) 148/78   Pulse (!) 56   Ht 6' (1.829 m)   Wt 270 lb 0.6 oz (122.5 kg)   SpO2 96%   BMI 36.62 kg/m     Wt Readings from Last 3 Encounters:  01/04/24 270 lb 0.6 oz (122.5 kg)  11/15/23 271 lb (122.9 kg)  02/02/23 262 lb (118.8 kg)     GEN: Patient is in no acute distress HEENT: Normal NECK: No JVD; No carotid bruits LYMPHATICS: No lymphadenopathy CARDIAC: S1 S2 regular, 2/6 systolic murmur at the apex. RESPIRATORY:  Clear to auscultation without rales, wheezing or rhonchi  ABDOMEN: Soft, non-tender, non-distended MUSCULOSKELETAL:  No edema; No deformity  SKIN: Warm and dry NEUROLOGIC:  Alert and oriented x 3 PSYCHIATRIC:  Normal affect    Signed, Jennifer JONELLE Crape, MD  01/04/2024 9:32 AM    [facility]

## 2024-01-04 NOTE — Telephone Encounter (Signed)
 Message left for pt to return call as well as MyChart message.

## 2024-01-04 NOTE — Telephone Encounter (Signed)
 Fairy with [employer] calling to report a serious EKG event.

## 2024-01-06 NOTE — Telephone Encounter (Signed)
 Recommendations reviewed with pt as per Dr. Kem Parkinson note.  Pt verbalized understanding and had no additional questions.

## 2024-01-30 ENCOUNTER — Ambulatory Visit (HOSPITAL_BASED_OUTPATIENT_CLINIC_OR_DEPARTMENT_OTHER)

## 2024-02-08 DIAGNOSIS — G459 Transient cerebral ischemic attack, unspecified: Secondary | ICD-10-CM | POA: Insufficient documentation

## 2024-02-08 DIAGNOSIS — I63433 Cerebral infarction due to embolism of bilateral posterior cerebral arteries: Secondary | ICD-10-CM | POA: Insufficient documentation

## 2024-02-10 ENCOUNTER — Telehealth: Payer: Self-pay

## 2024-02-10 ENCOUNTER — Other Ambulatory Visit: Payer: Self-pay | Admitting: Physician Assistant

## 2024-02-10 DIAGNOSIS — E559 Vitamin D deficiency, unspecified: Secondary | ICD-10-CM | POA: Insufficient documentation

## 2024-02-10 DIAGNOSIS — I480 Paroxysmal atrial fibrillation: Secondary | ICD-10-CM

## 2024-02-10 NOTE — Patient Instructions (Signed)
 Visit Information  Thank you for taking time to visit with me today.   *Take medication as prescribed *See physicians as scheduled *Monitor for chest pain, constant flutters in chest, shortness of breath, weakness, and feeling of passing out.   *Monitor heart rate *Watch for bleeding while on Blood Thinners--watch for blood in your stool which can make your stool black, maroon, mahogany or red---, blood in your urine which can make your urine pink or red, nosebleeds, also watch for possible bruising     Patient verbalizes understanding of instructions and care plan provided today and agrees to view in MyChart. Active MyChart status and patient understanding of how to access instructions and care plan via MyChart confirmed with patient.     The patient has been provided with contact information for the care management team and has been advised to call with any health related questions or concerns.   Please call the care guide team at [phone] if you need to cancel or reschedule your appointment.   Please call the Suicide and Crisis Lifeline: 988 call the [place]  National Suicide Prevention Lifeline: 720-275-4310 or TTY: (570)060-5298 TTY (251)201-7519) to talk to a trained counselor if you are experiencing a Mental Health or Behavioral Health Crisis or need someone to talk to.  Skylin Kennerson J. Ikenna Ohms RN, MSN [facility], [facility] RN Care Manager Direct Dial: [phone]  Fax: [phone] Website: [email or web address]

## 2024-02-10 NOTE — Transitions of Care (Post Inpatient/ED Visit) (Signed)
 02/10/2024  Name: Ernest Fuentes MRN: [id number] DOB: [birth year]  Today's TOC FU Call Status: Today's TOC FU Call Status:: Successful TOC FU Call Completed TOC FU Call Complete Date: 02/10/24 Patient's Name and Date of Birth confirmed.  Transition Care Management Follow-up Telephone Call Date of Discharge: 02/09/24 Discharge Facility: Other [facility]) Name of Other (Non-[facility]) Discharge Facility: [facility] Type of Discharge: Inpatient Admission Primary Inpatient Discharge Diagnosis:: Atrial fibrillation with RVR How have you been since you were released from the hospital?: Better Any questions or concerns?: No  Items Reviewed: Did you receive and understand the discharge instructions provided?: Yes Medications obtained,verified, and reconciled?: Yes (Medications Reviewed) Any new allergies since your discharge?: No Dietary orders reviewed?: Yes Type of Diet Ordered:: Low Sodium Do you have support at home?: Yes People in Home [RPT]: spouse Name of Support/Comfort Primary Source: Ernest Fuentes  Medications Reviewed Today: Medications Reviewed Today     Reviewed by Ernest Mcnally, RN (Case Manager) on 02/10/24 at 1153  Med List Status: <None>   Medication Order Taking? Sig Documenting Provider Last Dose Status Informant  amLODipine  (NORVASC ) 5 MG tablet [id number] Yes Take 5 mg by mouth daily. [provider]  Active   apixaban  (ELIQUIS ) 5 MG TABS tablet [id number] Yes Take 1 tablet (5 mg total) by mouth 2 (two) times daily. Fuentes, Ernest SAUNDERS, MD  Active   atenolol  (TENORMIN ) 50 MG tablet [id number]  TAKE 1 TABLET BY MOUTH EVERY DAY  Patient not taking: Reported on 02/10/2024   Fuentes, Ernest L, PA-C  Active   metoprolol tartrate (LOPRESSOR) 50 MG tablet [id number] Yes Take 50 mg by mouth 2 (two) times daily. [provider]  Active   omeprazole  (PRILOSEC) 40 MG capsule [id number] Yes Take 1 capsule (40 mg total) by mouth daily. Fuentes,  Ernest L, PA-C  Active   pravastatin  (PRAVACHOL ) 40 MG tablet [id number] Yes Take 40 mg by mouth daily. [provider]  Active             Home Care and Equipment/Supplies: Were Home Health Services Ordered?: NA Any new equipment or medical supplies ordered?: NA  Functional Questionnaire: Do you need assistance with bathing/showering or dressing?: No Do you need assistance with meal preparation?: No Do you need assistance with eating?: No Do you have difficulty maintaining continence: No Do you need assistance with getting out of bed/getting out of a chair/moving?: No Do you have difficulty managing or taking your medications?: No  Follow up appointments reviewed: PCP Follow-up appointment confirmed?: Yes Date of PCP follow-up appointment?: 02/13/24 Follow-up Provider: Dr. Antoniette Specialist [facility] Follow-up appointment confirmed?: Yes Date of Specialist follow-up appointment?: 02/17/24 Follow-Up Specialty Provider:: Cardiology at [facility] per patient Do you need transportation to your follow-up appointment?: No Do you understand care options if your condition(s) worsen?: Yes-patient verbalized understanding  SDOH Interventions Today    Flowsheet Row Most Recent Value  SDOH Interventions   Food Insecurity Interventions Intervention Not Indicated  Housing Interventions Intervention Not Indicated  Transportation Interventions Intervention Not Indicated  Utilities Interventions Intervention Not Indicated   *Take medication as prescribed *See physicians as scheduled *Monitor for chest pain, constant flutters in chest, shortness of breath, weakness, and feeling of passing out.   *Monitor heart rate *Watch for bleeding while on Blood Thinners--watch for blood in your stool which can make your stool black, maroon, mahogany or red---, blood in your urine which can make your urine pink or red, nosebleeds, also watch for possible bruising  Ernest Fuentes J. Ernest Wieck RN, MSN [facility], [facility] RN Care Manager Direct Dial: [phone]  Fax: [phone] Website: [email or web address]

## 2024-02-13 ENCOUNTER — Inpatient Hospital Stay: Admitting: Physician Assistant

## 2024-02-13 ENCOUNTER — Ambulatory Visit: Admitting: Physician Assistant

## 2024-02-13 ENCOUNTER — Encounter: Payer: Self-pay | Admitting: Physician Assistant

## 2024-02-13 VITALS — BP 114/61 | HR 87 | Resp 17 | Ht 72.0 in | Wt 265.0 lb

## 2024-02-13 DIAGNOSIS — H5330 Unspecified disorder of binocular vision: Secondary | ICD-10-CM | POA: Insufficient documentation

## 2024-02-13 DIAGNOSIS — J0140 Acute pansinusitis, unspecified: Secondary | ICD-10-CM

## 2024-02-13 DIAGNOSIS — I480 Paroxysmal atrial fibrillation: Secondary | ICD-10-CM | POA: Diagnosis not present

## 2024-02-13 DIAGNOSIS — R42 Dizziness and giddiness: Secondary | ICD-10-CM

## 2024-02-13 DIAGNOSIS — Z8673 Personal history of transient ischemic attack (TIA), and cerebral infarction without residual deficits: Secondary | ICD-10-CM | POA: Insufficient documentation

## 2024-02-13 DIAGNOSIS — E782 Mixed hyperlipidemia: Secondary | ICD-10-CM

## 2024-02-13 HISTORY — DX: Unspecified disorder of binocular vision: H53.30

## 2024-02-13 HISTORY — DX: Personal history of transient ischemic attack (TIA), and cerebral infarction without residual deficits: Z86.73

## 2024-02-13 HISTORY — DX: Dizziness and giddiness: R42

## 2024-02-13 MED ORDER — PRAVASTATIN SODIUM 40 MG PO TABS: 40.0000 mg | ORAL_TABLET | Freq: Every day | ORAL | 0 refills | Status: DC

## 2024-02-13 NOTE — Patient Instructions (Signed)
Atrial Fibrillation Atrial fibrillation (AFib) is a type of heartbeat that is irregular or fast. If you have AFib, your heart beats without any order. This makes it hard for your heart to pump blood in a normal way. AFib may come and go, or it may become a long-lasting problem. If AFib is not treated, it can put you at higher risk for stroke, heart failure, and other heart problems. What are the causes? AFib may be caused by diseases that damage the heart's electrical system. They include: High blood pressure. Heart failure. Heart valve diseases. Heart surgery. Diabetes. Thyroid disease. Kidney disease. Lung diseases, such as pneumonia or COPD. Sleep apnea. Sometimes the cause is not known. What increases the risk? You are more likely to develop AFib if: You are older. You exercise often and very hard. You have a family history of AFib. You are male. You are Caucasian. You are overweight. You smoke. You drink a lot of alcohol. What are the signs or symptoms? Common symptoms of this condition include: A feeling that your heart is beating very fast. Chest pain or discomfort. Feeling short of breath. Suddenly feeling light-headed or weak. Getting tired easily during activity. Fainting. Sweating. In some cases, there are no symptoms. How is this treated? Medicines to: Prevent blood clots. Treat heart rate or heart rhythm problems. Using devices, such as a pacemaker, to correct heart rhythm problems. Doing surgery to remove the part of the heart that sends bad signals. Closing an area where clots can form in the heart (left atrial appendage). In some cases, your doctor will treat other underlying conditions. Follow these instructions at home: Medicines Take over-the-counter and prescription medicines only as told by your doctor. Do not take any new medicines without first talking to your doctor. If you are taking blood thinners: Talk with your doctor before taking aspirin  or NSAIDs, such as ibuprofen. Take your medicines as told. Take them at the same time each day. Do not do things that could hurt or bruise you. Be careful to avoid falls. Wear an alert bracelet or carry a card that says you take blood thinners. Lifestyle Do not smoke or use any products that contain nicotine or tobacco. If you need help quitting, ask your doctor. Eat heart-healthy foods. Talk with your doctor about the right eating plan for you. Exercise regularly as told by your doctor. Do not drink alcohol. Lose weight if you are overweight. General instructions If you have sleep apnea, treat it as told by your doctor. Do not use diet pills unless your doctor says they are safe for you. Diet pills may make heart problems worse. Keep all follow-up visits. Your doctor will check your heart rate and rhythm regularly. Contact a doctor if: You notice a change in the speed, rhythm, or strength of your heartbeat. You are taking a blood-thinning medicine and you get more bruising. You get tired more easily when you move or exercise. You have a sudden change in weight. Get help right away if:  You have pain in your chest. You have trouble breathing. You have side effects of blood thinners, such as blood in your vomit, poop (stool), or pee (urine), or bleeding that cannot stop. You have any signs of a stroke. "BE FAST" is an easy way to remember the main warning signs: B - Balance. Dizziness, sudden trouble walking, or loss of balance. E - Eyes. Trouble seeing or a change in how you see. F - Face. Sudden weakness or loss of  feeling in the face. The face or eyelid may droop on one side. A - Arms.Weakness or loss of feeling in an arm. This happens suddenly and usually on one side of the body. S - Speech. Sudden trouble speaking, slurred speech, or trouble understanding what people say. T - Time.Time to call emergency services. Write down what time symptoms started. You have other signs of a  stroke, such as: A sudden, very bad headache with no known cause. Feeling like you may vomit (nausea). Vomiting. A seizure. These symptoms may be an emergency. Get help right away. Call 911. Do not wait to see if the symptoms will go away. Do not drive yourself to the hospital. This information is not intended to replace advice given to you by your health care provider. Make sure you discuss any questions you have with your health care provider. Document Revised: 01/13/2022 Document Reviewed: 01/13/2022 Elsevier Patient Education  2024 [employer].

## 2024-02-13 NOTE — Progress Notes (Signed)
 Established Patient Office Visit  Subjective   Patient ID: Fuentes Ernest, male    DOB: Jan 04, 1966  Age: 58 y.o. MRN: [id number]  Chief Complaint  Patient presents with   Medical Management of Chronic Issues    HPI Discussed the use of AI scribe software for clinical note transcription with the patient, who gave verbal consent to proceed.  History of Present Illness Ernest Fuentes is a 58 year old male with paroxysmal atrial fibrillation who presents with recent hospitalization for acute dizziness where he was found to be in atrial fibrillation and follow-up for upper respiratory symptoms.  Acute neurological symptoms and dizziness - On February 07, 2024, experienced acute onset dizziness lasting ten minutes, followed by intense headache and diaphoresis - No falls occurred during the episode - MRI demonstrated small bilateral occipital lobe acute non-hemorrhagic infarcts MRI HEAD  1.  Small bilateral occipital lobe acute/subacute nonhemorrhagic infarcts, largest in medial right occipital lobe measuring up to 3.5 cm in length.  This is likely retrospectively associated with distal right P2 and distal left V4 occlusions on current CT head neck exam in this patient with atrial fibrillation.  Consider correlation with echocardiogram to confirm or exclude underlying cardiac thrombus.  2.  No intracranial hemorrhage identified.  - Vision affected in both eyes after the event, which is atypical for him as symptoms are usually unilateral - No speech difficulties or other focal neurological deficits reported  Cardiac arrhythmia and hospitalization - History of paroxysmal atrial fibrillation - Found in atrial fibrillation with rapid ventricular response by EMS on February 07, 2024 - Hospitalized from September 30 to February 09, 2024 - Medication changed from atenolol  to metoprolol at discharge - Eliquis  restarted at discharge - Heart rhythm has improved since medication change - Alcohol  consumption the day prior to the initial event, recognized as a potential trigger for atrial fibrillation - No chest pain reported - Decreased oral fluid intake, raising concern for dehydration as a contributing factor to arrhythmia  Upper respiratory symptoms and sinus congestion - Presented to emergency room on February 12, 2024, with upper respiratory symptoms - Ongoing sinus congestion since the initial neurological event - CT head revealed paranasal sinusitis - Flu, COVID, and RSV testing negative - Symptoms have improved since initial presentation with flonase daily  Cardiopulmonary evaluation during recent hospitalization - Elevated D-dimer on February 12, 2024 - CTA negative for pulmonary thromboembolism - Elevated troponin noted - Cardiology consultation performed, he was referred to outpatient cardiology.     ROS See HPI.    Objective:     BP 114/61   Pulse 87   Resp 17   Ht 6' (1.829 m)   Wt 265 lb (120.2 kg)   SpO2 98%   BMI 35.94 kg/m  BP Readings from Last 3 Encounters:  02/13/24 114/61  01/04/24 136/80  11/15/23 132/85   Wt Readings from Last 3 Encounters:  02/13/24 265 lb (120.2 kg)  01/04/24 270 lb 0.6 oz (122.5 kg)  11/15/23 271 lb (122.9 kg)      Physical Exam Constitutional:      Appearance: Normal appearance. He is obese.  HENT:     Head: Normocephalic.  Neck:     Vascular: No carotid bruit.  Cardiovascular:     Rate and Rhythm: Normal rate. Rhythm irregular.  Pulmonary:     Effort: Pulmonary effort is normal.     Breath sounds: Normal breath sounds.  Musculoskeletal:     Cervical back: Normal range of  motion and neck supple. No tenderness.     Right lower leg: No edema.     Left lower leg: No edema.  Lymphadenopathy:     Cervical: No cervical adenopathy.  Neurological:     General: No focal deficit present.     Mental Status: He is alert and oriented to person, place, and time.  Psychiatric:        Mood and Affect: Mood normal.         Assessment & Plan:  .Sanjit was seen today for medical management of chronic issues.  Diagnoses and all orders for this visit:  PAF (paroxysmal atrial fibrillation) (HCC) -     Ambulatory referral to Cardiology  Binocular visual disturbance -     Ambulatory referral to Ophthalmology -     Ambulatory referral to Neurology  History of ischemic posterior cerebral artery stroke -     Ambulatory referral to Ophthalmology -     Ambulatory referral to Neurology  Dizziness -     Ambulatory referral to Neurology  Acute non-recurrent pansinusitis  Mixed hyperlipidemia -     pravastatin  (PRAVACHOL ) 40 MG tablet; Take 1 tablet (40 mg total) by mouth daily.    Assessment & Plan Paroxysmal atrial fibrillation Recent episode with rapid ventricular response managed by switching to metoprolol. Currently in irregular rhythm but asymptomatic. Alcohol may have been the initial trigger but he has not drank since then and in irregular rhythm today.  - Continue metoprolol. - Continue Eliquis . -  Stop Pravachol . Start Crestor  to get LDL to goal.  - Follow up with cardiologist on March 05, 2024. He does have cardiologist with [facility], Dr. Kerin and if we can get in earlier would like to stay with him. Urgent referral placed.  - Avoid alcohol, caffeine, and stress. - Ensure adequate hydration. - discussed reasons and symptoms to go back to ED.   Acute bilateral occipital lobe infarcts Recent acute non-hemorrhagic infarcts identified on MRI. No current neurological deficits. Focus on prevention of further events. - Refer to neurologist for evaluation and management. - Maintain blood pressure below 130/80 mmHg. - Maintain LDL cholesterol under 70 mg/dL. - Ensure regular intake of Eliquis . - referral to ophthalmology for vision changes and concerns  Paranasal sinusitis Sinusitis noted on CT scan. Antibiotics not initiated due to potential impact on heart rate. - Consider antibiotic  treatment if symptoms persist. - Continue flonase nasal spray as needed.  Hypertension Blood pressure management is crucial to prevent further cerebrovascular events. Recent readings have improved. - Maintain blood pressure below 130/80 mmHg. - Monitor blood pressure regularly.  Hyperlipidemia Management of cholesterol levels is important to reduce the risk of further cerebrovascular events. - Maintain LDL cholesterol under 70 mg/dL. Last LDL 111.  - Stop Pravachol . Start Crestor .   Chronic obstructive pulmonary disease (COPD) No acute exacerbation.  Spent 50 minutes in chart review and with patient and in coordination of care.   Earlee Herald, PA-C

## 2024-02-14 ENCOUNTER — Encounter: Payer: Self-pay | Admitting: Physician Assistant

## 2024-02-14 DIAGNOSIS — J0140 Acute pansinusitis, unspecified: Secondary | ICD-10-CM | POA: Insufficient documentation

## 2024-02-14 HISTORY — DX: Acute pansinusitis, unspecified: J01.40

## 2024-02-14 MED ORDER — ROSUVASTATIN CALCIUM 20 MG PO TABS: 20.0000 mg | ORAL_TABLET | Freq: Every day | ORAL | 3 refills | Status: AC

## 2024-02-17 ENCOUNTER — Ambulatory Visit: Payer: Self-pay | Admitting: Cardiology

## 2024-02-17 DIAGNOSIS — R002 Palpitations: Secondary | ICD-10-CM | POA: Diagnosis not present

## 2024-02-21 ENCOUNTER — Ambulatory Visit (HOSPITAL_BASED_OUTPATIENT_CLINIC_OR_DEPARTMENT_OTHER)

## 2024-02-21 ENCOUNTER — Telehealth: Payer: Self-pay

## 2024-02-21 ENCOUNTER — Encounter: Payer: Self-pay | Admitting: Physician Assistant

## 2024-02-21 ENCOUNTER — Ambulatory Visit: Admitting: Physician Assistant

## 2024-02-21 VITALS — BP 95/53 | HR 83 | Ht 72.0 in | Wt 267.0 lb

## 2024-02-21 DIAGNOSIS — E782 Mixed hyperlipidemia: Secondary | ICD-10-CM | POA: Diagnosis not present

## 2024-02-21 DIAGNOSIS — G4733 Obstructive sleep apnea (adult) (pediatric): Secondary | ICD-10-CM | POA: Diagnosis not present

## 2024-02-21 DIAGNOSIS — I480 Paroxysmal atrial fibrillation: Secondary | ICD-10-CM

## 2024-02-21 DIAGNOSIS — H539 Unspecified visual disturbance: Secondary | ICD-10-CM | POA: Insufficient documentation

## 2024-02-21 DIAGNOSIS — I10 Essential (primary) hypertension: Secondary | ICD-10-CM

## 2024-02-21 HISTORY — DX: Unspecified visual disturbance: H53.9

## 2024-02-21 MED ORDER — METOPROLOL TARTRATE 100 MG PO TABS: 100.0000 mg | ORAL_TABLET | Freq: Two times a day (BID) | ORAL | 0 refills | Status: DC

## 2024-02-21 NOTE — Patient Instructions (Addendum)
 Stop amlodipine . Increase metoprolol 100mg  twice a day. Via Cardiology.  Will send to DME company to get CPAP ordered.  Will fill out paperwork to be out of work until released by speciality.

## 2024-02-21 NOTE — Telephone Encounter (Signed)
 Pt present in office for disability paper work  to be filled out called

## 2024-02-21 NOTE — Progress Notes (Signed)
 Established Patient Office Visit  Subjective   Patient ID: Ernest Fuentes, male    DOB: 29-Sep-1965  Age: 58 y.o. MRN: [id number]  Chief Complaint  Patient presents with   Medical Management of Chronic Issues    HPI Discussed the use of AI scribe software for clinical note transcription with the patient, who gave verbal consent to proceed.  History of Present Illness Ernest Fuentes is a 58 year old male with PAF, HTN, HLD, OSA who presents with visual disturbances and dizziness.  Visual disturbances and dizziness - Cloudy vision and lightheadedness since Sunday - Symptoms resolve upon sitting down - Episodes have occurred twice, both times after eating - No associated blood pressure or blood sugar spikes - Ophthalmology appointment scheduled for March 05, 2024 - Works as a Animator, requiring good vision  Cardiac symptoms - History of cardiac issues - No current palpitations or awareness of heart rhythm changes - Recently returned a cardiac monitor and is awaiting results  Sleep disturbances and sleep apnea - History of sleep apnea - Recently completed a sleep study, results pending - Previously used a CPAP machine - Experiences a sensation of apnea when falling asleep, similar to past episodes  Medication changes - Recently started a new cholesterol medication - Visual disturbances and dizziness have only occurred in the last two days   ROS See HPI.    Objective:     BP (!) 95/53   Pulse 83   Ht 6' (1.829 m)   Wt 267 lb (121.1 kg)   SpO2 99%   BMI 36.21 kg/m  BP Readings from Last 3 Encounters:  02/21/24 (!) 95/53  02/13/24 114/61  01/04/24 136/80   Wt Readings from Last 3 Encounters:  02/21/24 267 lb (121.1 kg)  02/13/24 265 lb (120.2 kg)  01/04/24 270 lb 0.6 oz (122.5 kg)      Physical Exam Constitutional:      Appearance: Normal appearance. He is obese.  HENT:     Head: Normocephalic.  Cardiovascular:     Rate and Rhythm: Normal  rate. Rhythm irregular.  Pulmonary:     Effort: Pulmonary effort is normal.     Breath sounds: Normal breath sounds.  Musculoskeletal:     Right lower leg: No edema.     Left lower leg: No edema.  Neurological:     General: No focal deficit present.     Mental Status: He is alert and oriented to person, place, and time.  Psychiatric:        Mood and Affect: Mood normal.       Assessment & Plan:  SABRASABRAChristoher was seen today for medical management of chronic issues.  Diagnoses and all orders for this visit:  PAF (paroxysmal atrial fibrillation) (HCC) -     Magnesium -     CMP14+EGFR -     TSH + free T4 -     metoprolol tartrate (LOPRESSOR) 100 MG tablet; Take 1 tablet (100 mg total) by mouth 2 (two) times daily.  Mixed hyperlipidemia -     Magnesium -     CMP14+EGFR -     TSH + free T4  Essential hypertension -     Magnesium -     CMP14+EGFR -     TSH + free T4 -     metoprolol tartrate (LOPRESSOR) 100 MG tablet; Take 1 tablet (100 mg total) by mouth 2 (two) times daily.  Moderate obstructive sleep apnea  Vision changes  Assessment and Plan Assessment & Plan  Visual disturbances and dizziness. Awaiting ophthalmology evaluation. Neurology follow-up required for work clearance. - Await ophthalmology evaluation on October 27. - Ensure neurology follow-up for clearance to return to work. - low BP could be causes some of the dizziness and vision changes.   Paroxysmal atrial fibrillation Currently irregular rhythm. No palpitations.  - Reviewed cardiac monitor results from last Monday and noted cardiology, Dr. Kerin, comment to double metoprolol and stop amlodipine .  - continue on eliquis   Hypertension BP on low side of normal. Pt will stop amlodipine  today.   Mixed hyperlipidemia Started on higher statin dose to achieve LDL target under 70, ideally under 50, due to increased cardiovascular risk.  - Continue current statin therapy. - Monitor for tolerance and side  effects.  Obstructive sleep apnea Recent sleep study shows moderate obstructive sleep apnea. Symptoms suggestive of apnea. - Send orders for CPAP setup with pressures of 4 to 20 cm H2O. - follow up in 30 days for compliance.   FMLA filled out today writing patient out of work until released by 3 specialist approximately November 15th return date.     Return in about 4 weeks (around 03/20/2024).    Ernest Disbro, PA-C

## 2024-02-22 ENCOUNTER — Telehealth: Payer: Self-pay

## 2024-02-22 ENCOUNTER — Ambulatory Visit: Payer: Self-pay | Admitting: Physician Assistant

## 2024-02-22 LAB — CMP14+EGFR
ALT: 21 IU/L (ref 0–44)
AST: 29 IU/L (ref 0–40)
Albumin: 4.2 g/dL (ref 3.8–4.9)
Alkaline Phosphatase: 136 IU/L — ABNORMAL HIGH (ref 47–123)
BUN/Creatinine Ratio: 10 (ref 9–20)
BUN: 11 mg/dL (ref 6–24)
Bilirubin Total: 1 mg/dL (ref 0.0–1.2)
CO2: 25 mmol/L (ref 20–29)
Calcium: 9.8 mg/dL (ref 8.7–10.2)
Chloride: 102 mmol/L (ref 96–106)
Creatinine, Ser: 1.12 mg/dL (ref 0.76–1.27)
Globulin, Total: 3 g/dL (ref 1.5–4.5)
Glucose: 101 mg/dL — ABNORMAL HIGH (ref 70–99)
Potassium: 4.4 mmol/L (ref 3.5–5.2)
Sodium: 139 mmol/L (ref 134–144)
Total Protein: 7.2 g/dL (ref 6.0–8.5)
eGFR: 76 mL/min/1.73 (ref >59)

## 2024-02-22 LAB — TSH+FREE T4
Free T4: 1.25 ng/dL (ref 0.82–1.77)
TSH: 4.16 u[IU]/mL (ref 0.450–4.500)

## 2024-02-22 LAB — MAGNESIUM: Magnesium: 2.2 mg/dL (ref 1.6–2.3)

## 2024-02-22 NOTE — Progress Notes (Signed)
 FYI. Pt seen yesterday. Scheduled to see you later this month. This is the bloodwork you wanted.

## 2024-02-22 NOTE — Telephone Encounter (Signed)
 Left vm to return call as I do not see results from a cardiac CT at [facility]. I only see CT angio pulmonary.  Pt and wife returned call. Advised that a cardiac CT was not done per [facility]. Discussed testing that was done and what each was for. Both parties verbalized understanding and ok to schedule.

## 2024-02-22 NOTE — Telephone Encounter (Signed)
 Copied from CRM [id number]. Topic: Clinical - Medication Question >> Feb 22, 2024  8:49 AM [name] wrote: Pt wants to know if he can get a prescription for vitamin D

## 2024-02-22 NOTE — Telephone Encounter (Signed)
-----   Message from [place] sent at 02/22/2024 12:45 PM EDT ----- Regarding: RE: ct scan He saying he did have one. One on his heart and one for his lungs. ----- Message ----- From: Oneita Berliner, RN Sent: 02/22/2024  12:42 PM EDT To: [place] L Lynch Subject: RE: ct scan                                    It was not a cardiac CT. He had a chest angio CT which is typically done for blood clots in the lungs.  Berliner PEAK ----- Message ----- From: Leodis Laymon CROME Sent: 02/22/2024  11:27 AM EDT To: Berliner Oneita, RN Subject: ct scan                                        Good Morning,   I was finally able to get in contact with [facility]. He stated he had is cta scan done at [facility]. Can you confirm if he did or not?   Thanks, [place]

## 2024-02-22 NOTE — Progress Notes (Addendum)
 Dear Vermell LITTIE Bologna, PA,   Thank you for the opportunity to see Claudio Mondry Marsolek in neurological consultation.  Below, you will find my History and Physical report.  Please do not hesitate to contact my office if you have any questions or concerns.  Thank you for allowing me to participate in his care with you.    Sincerely, Warren Flicker, FNP  Subjective   Patient ID:  Ernest Fuentes is a 58 y.o. (DOB 1965/10/31) male   Date of Admission-Discharge:  September 30-February 09, 2024 Presenting symptoms:  Per hospital notes: recently found with paroxysmal atrial fibrillation for which he has been evaluated August 2025 by cardiology (in [place]) and recommended that time to have a 2D echo but also to start Eliquis  I have sleep study as well, patient was brought to the emergency room tonight, after acute onset of dizziness, decreased bilateral peripheral vision while watching TV around 8 PM, also feeling palpitations. Per patient, the impaired vision lasted about 10 minutes and was followed by intense headache and some diaphoresis.   Acute Stroke Treatment Provided: Did not receive IV TPa/Thrombectomy   MRI Head WO Contrast 1.  Small bilateral occipital lobe acute/subacute nonhemorrhagic infarcts, largest in medial right occipital lobe measuring up to 3.5 cm in length.  This is likely retrospectively associated with distal right P2 and distal left V4 occlusions on current CT head neck exam in this patient with atrial fibrillation.  Consider correlation with echocardiogram to confirm or exclude underlying cardiac thrombus. 2.  No intracranial hemorrhage identified. Electronically Signed by: Charlie Patch, MD on 02/08/2024 5:00 AM    CT angiography:  02/08/24  CTA NECK: No significant carotid or vertebral artery stenosis. No occlusion. CTA HEAD: No large vessel occlusion. Vasculature within normal limits.  The most recent echocardiogram results, from Hospital Encounter on 02/07/24    Echocardiogram Complete W Enhancing Agent  Impression Left Ventricle: Left ventricle size is normal. Systolic function is mild to moderately abnormal. EF: 40-45%. .  Right Ventricle: Systolic function is grossly normal. Normal tricuspid annular plane systolic excursion (TAPSE) >1.7 cm. .  Pericardium: There is no pericardial effusion.  Shunt not commented on.  EKG: atrial fibrillation   Serum Risk Factor Labs: Lab Results  Component Value Date   LDL 96 02/09/2024   CHOLESTEROL TOTAL 151 02/09/2024   Trig 86 02/09/2024   Hemoglobin A1c 4.6 (L) 02/09/2024   Hypercoagulable Panel:   not applicable  Antiplatelet/Anticoagulation medications prior to event:  Eliquis - ordered but he was not taking.  According to the patient, he was taking the Eliquis  daily but he was told by cardiologist that he could stop taking it at his discretion. Patient understood that to be, he could take it as he wished. That made him stop taking it and he came to the hospital as noted above. Review of Cardiology notes from 01/04/24: Paroxysmal atrial fibrillation: He was told in the past the need and importance of anticoagulation. He decided not to pursue this. He still is not committed to anticoagulation. Benefits and potential risks explained. Watchman device explained. He wants to think about this.   Tobacco Use: Low Risk  (02/22/2024)   Patient History   . Smoking Tobacco Use: Never   . Smokeless Tobacco Use: Never   . Passive Exposure: Not on file  Recent Concern: Tobacco Use - Medium Risk (02/21/2024)   Received from [facility]   Patient History   . Smoking Tobacco Use: Former   . Smokeless Tobacco Use:  Former   . Passive Exposure: Not on file    The patient's discharge summary, testing and medications were reviewed and reconciled.   Impression   1. Cerebrovascular accident (CVA) due to bilateral embolism of posterior cerebral arteries (*)   2. OSA (obstructive sleep apnea)   3. Hyperlipidemia  LDL goal <70   4. Primary hypertension   5. Paroxysmal atrial fibrillation (*)   6. Monocular vision loss      Etiology of Stroke:  likely Cardio embolic- in the setting of Atrial fibrillation not taking anticoagulation  Residual symptoms:  monocular vision loss of left eye left peripheral vision.  Stroke risk factors: atrial fibrillation, hyperlipidemia, hypertension, and Obstructive Sleep Apnea (waiting on CPAP delivery )   Plan   No orders of the defined types were placed in this encounter.    -  Antiplatelet therapy: Not indicated  -  Anticoagulation therapy: Eliquis  5 mg twice daily- patient reports he is taking at this time.  -  Lipid-lowering agents:  Review of PCP notes indicates that they started Crestor  20mg  yesterday.   -  Blood Pressure: At goal in clinic:  Reports home SBP 140-150s PCP has been adjusting his medications recently.   - Start Vitamin D supplementation.   -  Rehab Needs Addressed: Patient denies any rehab needs at this time and Vision therapy may be beneficial Follow up in 6 months (on 08/22/2024).  -PCP follow up: yesterday  -Ophthalmology: 03/06/24 - Dr Katheleen in [place]   Discussed driving risks with patient due to current deficits effecting his vision. Recommended follow up with an ophthalmologist to evaluate the patient to help determine the safety of driving with current vision impairment.    -  Encouraged patient to maintain a blood pressure log and to follow up with PCP for blood pressure management. -  Discussed stroke risk factor modification  Long-Term Secondary Stroke Prevention Goals & Recommendations: -Goal blood pressure < 130/80 mmHg  - Goal LDL < 70: For people with diabetes and ASCVD, treatment with high-intensity statin therapy is recommended to target an LDL cholesterol reduction of >=50% from baseline and an LDL cholesterol goal of <55 mg/dL (<8.5 mmol/L). Addition of ezetimibe or a PCSK9 inhibitor with proven benefit in this  population is recommended if this goal is not achieved on maximum tolerated statin therapy. Standards of Care in Diabetes (2024) from American Diabetes Association (ADA) - Goal Hemoglobin A1c < 7%, and if a diagnosis of Diabetes, use of glucose lowering agents (GLP-1 receptor agonists, SGLT2 inhibitors, Thiazolidinedione) with proven cardiovascular benefit to reduce future risk of major adverse cardiovascular events (MACE) -Smoking Cessation (if applicable)  -Obstructive Sleep Apnea management (if applicable) -Low-Sodium Mediterranean diet  -Medication Compliance  -Regular Exercise (30 minutes per day, five days per week)  -Sleep health: 7-9 hours of sleep per night for adults 18-64, and 7-8 hours nightly for older adults   Risks, benefits, and alternatives of the medications and treatment plan prescribed today were discussed.  The patient expressed understanding of the instructions.  Past Medical History, Past Surgery History, Allergies, Social History, and Family History were reviewed and updated.    Medications Ordered Prior to Encounter[1]   Review of Systems is complete and negative except as noted.  Objective   BP 132/64 (BP Location: Left Upper Arm, Patient Position: Sitting)   Pulse 73   Temp 97.4 F (36.3 C) (Skin)   Wt 265 lb (120.2 kg)   BMI 35.94 kg/m   General:  Alert and  oriented, no acute distress HENNT:  Neck is supple.  Eyes anicteric.  Moist oral mucosa. CV:  Regular rate and rhythm.  No obvious murmurs.  No carotid bruits. Chest:  Lungs are clear to auscultation. Extremities:  No peripheral edema Skin:  No rash  Neurologic: Mental Status:  Alert and oriented.  Language and speech appear grossly intact.  Memory, concentration and fund of knowledge are grossly intact.  Cranial nerve Exam:  monocular vision loss of left eye left peripheral vision.  Pupils are equal, round and reactive.  Extraocular movements are intact without nystagmus.  Normal facial sensation.   No facial asymmetry.  Gross auditory is intact.  Tongue is midline and uvula and palate elevates symmetrically.  Motor Exam:  5/5 strength in the right upper and right lower extremities. 5/5 strength in the left upper and left lower extremities. Good muscle tone and bulk.  No pronator drift. Involuntary Movements: Absent    Coordination:  Intact to finger to nose. No dysmetria   Gait:  Normal routine gait.   Reflexes:  +1 in the upper and lower extremities.      Sensation:  Intact to light touch         *This note was dictated with voice recognition software. Inadvertently, similar sounding words can, sometimes, get transcribed incorrectly                 [1] Current Outpatient Medications on File Prior to Visit  Medication Sig Dispense Refill  . acetaminophen  (TYLENOL ) 500 mg tablet Take one tablet (500 mg dose) by mouth every 6 (six) hours as needed for Pain.    . amLODIPine  besylate (NORVASC ) 5 mg tablet Take one tablet (5 mg dose) by mouth daily. (Patient not taking: Reported on 02/22/2024) 30 tablet 0  . apixaban  (ELIQUIS ) 5 mg tablet Take one tablet (5 mg dose) by mouth 2 (two) times daily. 60 tablet 0  . apixaban  (ELIQUIS ) 5 mg tablet Take one tablet (5 mg dose) by mouth 2 (two) times daily. 60 tablet 0  . Cholecalciferol (VITAMIN D) 1.25 MG (50000 UT) CAPS Take 50,000 Units by mouth once a week for 8 doses. (Patient not taking: Reported on 02/22/2024) 8 capsule 0  . fluticasone propionate (FLONASE) 50 mcg/actuation nasal spray one spray by Both Nostrils route daily. 16 g 0  . metoprolol tartrate (LOPRESSOR) 50 mg tablet Take one tablet (50 mg dose) by mouth 2 (two) times daily. 60 tablet 0  . omeprazole  (PRILOSEC) 40 mg capsule Take one capsule (40 mg dose) by mouth daily as needed (HEARTBURN).    . pravastatin  sodium (PRAVACHOL ) 40 mg tablet Take one tablet (40 mg dose) by mouth every morning.     No current facility-administered medications on file prior to visit.

## 2024-02-22 NOTE — Telephone Encounter (Signed)
 Looks like you have one on file from historical provider starting 10/3.

## 2024-02-23 NOTE — Telephone Encounter (Signed)
 Left a vm msg for the patient, that ER provider sent in a rx to the pharmacy on 02/10/24. Patient informed to check with the pharmacy regarding the Vit D rx. Direct call back info provided in case the patient has any questions.

## 2024-02-23 NOTE — Telephone Encounter (Signed)
 Called patient, LVM to inform of paperwork ready for pick up, forms placed under W's upfront in accordion, thanks.

## 2024-02-23 NOTE — Telephone Encounter (Signed)
 Patient came into office to get forms this afternoon, patient would also like to know the status of the paperwork for his CPAP machine, please advise, thanks.

## 2024-02-24 ENCOUNTER — Telehealth: Payer: Self-pay

## 2024-02-24 ENCOUNTER — Ambulatory Visit (HOSPITAL_COMMUNITY)
Admission: RE | Admit: 2024-02-24 | Discharge: 2024-02-24 | Disposition: A | Discharge status: Home / Self Care | Source: Ambulatory Visit | Attending: Cardiology | Admitting: Cardiology

## 2024-02-24 DIAGNOSIS — I480 Paroxysmal atrial fibrillation: Secondary | ICD-10-CM | POA: Insufficient documentation

## 2024-02-24 DIAGNOSIS — I700 Atherosclerosis of aorta: Secondary | ICD-10-CM | POA: Diagnosis not present

## 2024-02-24 DIAGNOSIS — I2510 Atherosclerotic heart disease of native coronary artery without angina pectoris: Secondary | ICD-10-CM | POA: Diagnosis not present

## 2024-02-24 MED ORDER — NITROGLYCERIN 0.4 MG SL SUBL
0.8000 mg | SUBLINGUAL_TABLET | Freq: Once | SUBLINGUAL | Status: AC
  Administered 2024-02-24: 0.8 mg via SUBLINGUAL

## 2024-02-24 MED ORDER — IOHEXOL 350 MG/ML SOLN
100.0000 mL | Freq: Once | INTRAVENOUS | Status: AC | PRN
  Administered 2024-02-24: 100 mL via INTRAVENOUS

## 2024-02-24 NOTE — Telephone Encounter (Signed)
 This task  was completed by [facility]

## 2024-02-24 NOTE — Telephone Encounter (Signed)
 Can you check on this? Where did we send?

## 2024-02-24 NOTE — Telephone Encounter (Signed)
 Copied from CRM [id number]. Topic: General - Other >> Feb 23, 2024  4:37 PM Santiya F wrote: Reason for CRM: Patient is calling in returning a call from the office. Informed patient that his paperwork was ready to be picked up per notes in chart. Patient understood.

## 2024-02-27 ENCOUNTER — Ambulatory Visit: Payer: Self-pay | Admitting: Cardiology

## 2024-03-02 ENCOUNTER — Ambulatory Visit: Attending: Cardiology | Admitting: Cardiology

## 2024-03-02 ENCOUNTER — Encounter: Payer: Self-pay | Admitting: Cardiology

## 2024-03-02 VITALS — BP 118/72 | HR 58 | Ht 72.0 in | Wt 267.4 lb

## 2024-03-02 DIAGNOSIS — J431 Panlobular emphysema: Secondary | ICD-10-CM

## 2024-03-02 DIAGNOSIS — I700 Atherosclerosis of aorta: Secondary | ICD-10-CM | POA: Diagnosis not present

## 2024-03-02 DIAGNOSIS — I63433 Cerebral infarction due to embolism of bilateral posterior cerebral arteries: Secondary | ICD-10-CM | POA: Diagnosis not present

## 2024-03-02 DIAGNOSIS — E782 Mixed hyperlipidemia: Secondary | ICD-10-CM

## 2024-03-02 DIAGNOSIS — I480 Paroxysmal atrial fibrillation: Secondary | ICD-10-CM

## 2024-03-02 DIAGNOSIS — I10 Essential (primary) hypertension: Secondary | ICD-10-CM | POA: Diagnosis not present

## 2024-03-02 DIAGNOSIS — G4733 Obstructive sleep apnea (adult) (pediatric): Secondary | ICD-10-CM

## 2024-03-02 NOTE — Patient Instructions (Signed)
 Medication Instructions:  Your physician recommends that you continue on your current medications as directed. Please refer to the Current Medication list given to you today.  *If you need a refill on your cardiac medications before your next appointment, please call your pharmacy*   Lab Work: Your physician recommends that you return for lab work in: 6 weeks for CMP and lipids. You need to have labs done when you are fasting. [facility] lab is located on the 3rd floor, [address]. Hours are Monday - Friday 8 am to 4 pm, closed 11:30 am to 1:00 pm. You do NOT need an appointment.   If you have labs (blood work) drawn today and your tests are completely normal, you will receive your results only by: MyChart Message (if you have MyChart) OR A paper copy in the mail If you have any lab test that is abnormal or we need to change your treatment, we will call you to review the results.   Testing/Procedures: None ordered   Follow-Up: At [facility], you and your health needs are our priority.  As part of our continuing mission to provide you with exceptional heart care, we have created designated Provider Care Teams.  These Care Teams include your primary Cardiologist (physician) and Advanced Practice Providers (APPs -  Physician Assistants and Nurse Practitioners) who all work together to provide you with the care you need, when you need it.  We recommend signing up for the patient portal called MyChart.  Sign up information is provided on this After Visit Summary.  MyChart is used to connect with patients for Virtual Visits (Telemedicine).  Patients are able to view lab/test results, encounter notes, upcoming appointments, etc.  Non-urgent messages can be sent to your provider as well.   To learn more about what you can do with MyChart, go to [email or web address].    Your next appointment:   9 month(s)  The format for your next appointment:   In Person  Provider:   Jennifer Crape, MD    Other Instructions none  Important Information About Sugar

## 2024-03-02 NOTE — Progress Notes (Signed)
 Cardiology Office Note:    Date:  03/02/2024   ID:  Ernest Fuentes, DOB June 13, 1965, MRN [id number]  PCP:  Antoniette Vermell CROME, PA-C  Cardiologist:  Jennifer JONELLE Crape, MD   Referring MD: Antoniette Vermell CROME, PA-C    ASSESSMENT:    1. Aortic atherosclerosis   2. Cerebrovascular accident (CVA) due to bilateral embolism of posterior cerebral arteries (HCC)   3. Essential hypertension   4. PAF (paroxysmal atrial fibrillation) (HCC)   5. Panlobular emphysema (HCC)   6. OSA (obstructive sleep apnea)   7. Mixed hyperlipidemia    PLAN:    In order of problems listed above:  Coronary artery disease: Secondary prevention stressed with the patient.  Importance of compliance with diet medication stressed and vocalized understanding he was advised to walk on a regular basis. Paroxysmal atrial fibrillation:I discussed with the patient atrial fibrillation, disease process. Management and therapy including rate and rhythm control, anticoagulation benefits and potential risks were discussed extensively with the patient. Patient had multiple questions which were answered to patient's satisfaction.  His atrial fibrillation burden is significant so we will refer him to our electrophysiology colleagues for evaluation for further intervention including pharmacotherapeutic and possible ablation related consideration.  He also could be a candidate in the future for Watchman device. History of stroke: Managed by primary care. Mixed dyslipidemia: On lipid-lowering medications.  He will be back in 6 weeks for lipid check.  Diet and excise stressed.  Goal LDL less than 60. Obesity: Weight reduction stressed diet emphasized and he promises to do better. Patient will be seen in follow-up appointment in 6 months or earlier if the patient has any concerns.    Medication Adjustments/Labs and Tests Ordered: Current medicines are reviewed at length with the patient today.  Concerns regarding medicines are outlined above.   No orders of the defined types were placed in this encounter.  No orders of the defined types were placed in this encounter.    No chief complaint on file.    History of Present Illness:    Ernest Fuentes is a 58 y.o. male.  Patient has past medical history of mild coronary atherosclerosis, essential hypertension, mixed dyslipidemia and paroxysmal atrial fibrillation.  He has a history of stroke.  He denies any chest pain orthopnea or PND.  At the time of my evaluation, the patient is alert awake oriented and in no distress.  Overall he leads a sedentary lifestyle.  He is monitoring his significant atrial fibrillation.  Past Medical History:  Diagnosis Date   Acute non-recurrent pansinusitis 02/14/2024   Acute pain of right knee 11/15/2023   Acute right-sided low back pain without sciatica 11/15/2023   Allergic drug reaction 09/25/2018   Anxiety and depression 11/14/2020   Aortic atherosclerosis 07/24/2020   Atherosclerotic vascular disease 07/24/2020   Benign prostatic hyperplasia with nocturia 02/02/2023   Binocular visual disturbance 02/13/2024   Bradycardia 07/25/2019   Cardiac murmur 12/07/2019   Cerebrovascular accident (CVA) due to bilateral embolism of posterior cerebral arteries (HCC) 02/08/2024   Chronic sinusitis 07/22/2020   Colon polyps 07/25/2019   Pending pathology.    COPD (chronic obstructive pulmonary disease) (HCC)    Depressed mood 09/17/2022   Dizziness 02/13/2024   Drug-induced erectile dysfunction 02/02/2023   Essential hypertension 06/21/2018   Family history of prostate cancer 09/11/2018   Ganglion cyst of dorsum of right wrist 06/21/2018   Gastroesophageal reflux disease without esophagitis 11/15/2023   History of ischemic posterior cerebral artery stroke 02/13/2024  Hyperlipidemia 06/23/2018   CV risk 19.3 percent 06/2018. Discussed statin.    Immunization reaction 11/14/2020   To shingrix . Declines any further dosages.      Irritant contact  dermatitis due to detergent 09/12/2018   Moderate obstructive sleep apnea 06/21/2018   Non-restorative sleep 08/28/2021   Obesity 07/22/2020   OSA (obstructive sleep apnea) 06/21/2018   PAF (paroxysmal atrial fibrillation) (HCC) 07/25/2019   Seen on EKG during colonoscopy 07/2019.   Primary hypertension 11/15/2023   Snoring 08/28/2021   TIA (transient ischemic attack) 02/08/2024   Tobacco use disorder 07/22/2020   Jul 29, 2008 Entered By: GENELL CRANK C Comment: remitting   Urticaria 09/25/2018   Vision changes 02/21/2024   Vitamin D deficiency 02/10/2024    Past Surgical History:  Procedure Laterality Date   NO PAST SURGERIES      Current Medications: Current Meds  Medication Sig   omeprazole  (PRILOSEC) 40 MG capsule Take 1 capsule (40 mg total) by mouth daily. (Patient taking differently: Take 40 mg by mouth daily as needed.)     Allergies:   Asa [aspirin] and Atorvastatin    Social History   Socioeconomic History   Marital status: Single    Spouse name: Not on file   Number of children: Not on file   Years of education: Not on file   Highest education level: Not on file  Occupational History   Not on file  Tobacco Use   Smoking status: Former   Smokeless tobacco: Former  Advertising account planner   Vaping status: Never Used  Substance and Sexual Activity   Alcohol use: Yes    Alcohol/week: 6.0 standard drinks of alcohol    Types: 6 Cans of beer per week    Comment: 0cc   Drug use: Never   Sexual activity: Yes  Other Topics Concern   Not on file  Social History Narrative   Not on file   Social Drivers of Health   Financial Resource Strain: Medium Risk (02/22/2024)   Received from [facility]   Overall Financial Resource Strain (CARDIA)    How hard is it for you to pay for the very basics like food, housing, medical care, and heating?: Somewhat hard  Food Insecurity: No Food Insecurity (02/22/2024)   Received from [facility]   Hunger Vital Sign    Within the  past 12 months, you worried that your food would run out before you got the money to buy more.: Never true    Within the past 12 months, the food you bought just didn't last and you didn't have money to get more.: Never true  Transportation Needs: No Transportation Needs (02/22/2024)   Received from [facility] - Transportation    In the past 12 months, has lack of transportation kept you from medical appointments or from getting medications?: No    In the past 12 months, has lack of transportation kept you from meetings, work, or from getting things needed for daily living?: No  Physical Activity: Not on file  Stress: Not on file  Social Connections: Unknown (09/22/2021)   Received from [facility]   Social Network    Social Network: Not on file     Family History: The patient's family history includes Cancer in an other family member; Diabetes in an other family member; Hypertension in his father, mother, and another family member; Prostate cancer in his father; Stroke in his father and sister.  ROS:   Please see the history of  present illness.    All other systems reviewed and are negative.  EKGs/Labs/Other Studies Reviewed:    The following studies were reviewed today: .SABRA   I discussed my findings with the patient at length   Recent Labs: 11/15/2023: Hemoglobin 14.6; Platelets 227 02/21/2024: ALT 21; BUN 11; Creatinine, Ser 1.12; Magnesium 2.2; Potassium 4.4; Sodium 139; TSH 4.160  Recent Lipid Panel    Component Value Date/Time   CHOL 184 11/15/2023 0932   TRIG 144 11/15/2023 0932   HDL 48 11/15/2023 0932   CHOLHDL 3.8 11/15/2023 0932   CHOLHDL 3.8 09/17/2022 0849   LDLCALC 111 (H) 11/15/2023 0932   LDLCALC 104 (H) 09/17/2022 0849    Physical Exam:    VS:  BP 118/72   Pulse (!) 58   Ht 6' (1.829 m)   Wt 267 lb 6.4 oz (121.3 kg)   SpO2 99%   BMI 36.27 kg/m     Wt Readings from Last 3 Encounters:  03/02/24 267 lb 6.4 oz (121.3 kg)  02/21/24 267 lb  (121.1 kg)  02/13/24 265 lb (120.2 kg)     GEN: Patient is in no acute distress HEENT: Normal NECK: No JVD; No carotid bruits LYMPHATICS: No lymphadenopathy CARDIAC: Hear sounds regular, 2/6 systolic murmur at the apex. RESPIRATORY:  Clear to auscultation without rales, wheezing or rhonchi  ABDOMEN: Soft, non-tender, non-distended MUSCULOSKELETAL:  No edema; No deformity  SKIN: Warm and dry NEUROLOGIC:  Alert and oriented x 3 PSYCHIATRIC:  Normal affect   Signed, Jennifer JONELLE Crape, MD  03/02/2024 1:02 PM    [facility]

## 2024-03-19 ENCOUNTER — Telehealth: Payer: Self-pay

## 2024-03-19 NOTE — Telephone Encounter (Addendum)
 CT 02/24/2024  Max 26.5/ AVG 25/ Depth 19 Likely use a 31mm device Inf/Mid TSP RAO 25/ CAU 15

## 2024-03-20 ENCOUNTER — Ambulatory Visit (INDEPENDENT_AMBULATORY_CARE_PROVIDER_SITE_OTHER): Admitting: Physician Assistant

## 2024-03-20 ENCOUNTER — Encounter: Payer: Self-pay | Admitting: Physician Assistant

## 2024-03-20 VITALS — BP 130/80 | HR 67 | Ht 72.0 in | Wt 271.0 lb

## 2024-03-20 DIAGNOSIS — I10 Essential (primary) hypertension: Secondary | ICD-10-CM | POA: Diagnosis not present

## 2024-03-20 DIAGNOSIS — H60392 Other infective otitis externa, left ear: Secondary | ICD-10-CM

## 2024-03-20 DIAGNOSIS — Z23 Encounter for immunization: Secondary | ICD-10-CM

## 2024-03-20 DIAGNOSIS — G4733 Obstructive sleep apnea (adult) (pediatric): Secondary | ICD-10-CM

## 2024-03-20 DIAGNOSIS — E559 Vitamin D deficiency, unspecified: Secondary | ICD-10-CM

## 2024-03-20 DIAGNOSIS — I480 Paroxysmal atrial fibrillation: Secondary | ICD-10-CM

## 2024-03-20 DIAGNOSIS — E782 Mixed hyperlipidemia: Secondary | ICD-10-CM

## 2024-03-20 MED ORDER — CIPROFLOXACIN-DEXAMETHASONE 0.3-0.1 % OT SUSP: 4.0000 [drp] | Freq: Two times a day (BID) | OTIC | 0 refills | Status: AC

## 2024-03-20 NOTE — Patient Instructions (Signed)
 Check Vitamin D level

## 2024-03-21 ENCOUNTER — Ambulatory Visit: Payer: Self-pay | Admitting: Physician Assistant

## 2024-03-21 ENCOUNTER — Encounter: Payer: Self-pay | Admitting: Physician Assistant

## 2024-03-21 LAB — VITAMIN D 25 HYDROXY (VIT D DEFICIENCY, FRACTURES): Vit D, 25-Hydroxy: 44.8 ng/mL (ref 30.0–100.0)

## 2024-03-21 NOTE — Progress Notes (Signed)
 Established Patient Office Visit  Subjective   Patient ID: SEYON STRADER, male    DOB: 30-Mar-1966  Age: 58 y.o. MRN: [id number]  Chief Complaint  Patient presents with   Medical Management of Chronic Issues    HPI .SABRADiscussed the use of AI scribe software for clinical note transcription with the patient, who gave verbal consent to proceed.  History of Present Illness KALAN YELEY is a 58 year old male with sleep apnea and atrial fibrillation who presents for follow-up on CPAP therapy and cardiac rhythm management.  Obstructive sleep apnea and cpap therapy - Using CPAP machine nightly for approximately two and a half weeks - Achieves five to six hours of sleep per night - Improved sleep quality and increased energy since starting therapy - Reduction in shortness of breath, which was present prior to CPAP initiation - Noted benefits of CPAP therapy by the second night of use  Cardiac rhythm disturbances - History of atrial fibrillation - Episodes of heart rate reaching 190 bpm during physical activity, occurring infrequently - Heart rhythm typically returns to normal spontaneously, confirmed by home monitoring device - Avoids alcohol and caffeine - Follow-up with cardiology scheduled for November 25th  Blood pressure monitoring - Monitors blood pressure at home - Typical readings around 124/71 mmHg - Slightly elevated blood pressure reading this morning prior to medication administration  Ocular/nasal/left ear symptoms - Right eye does not open upon waking in the morning, resolves after a short time - Associated with sinus congestion and nasal blockage - Uses Flonase for sinus symptoms - left ear feels clogged and some discharged noted.   Vitamin d supplementation - Currently on high-dose vitamin D regimen: one capsule weekly for eight weeks - Two weeks remaining in the course   ROS See HPI.    Objective:     BP 130/80   Pulse 67   Ht 6' (1.829 m)   Wt 271 lb  (122.9 kg)   SpO2 99%   BMI 36.75 kg/m  BP Readings from Last 3 Encounters:  03/20/24 130/80  03/02/24 118/72  02/24/24 122/73   Wt Readings from Last 3 Encounters:  03/20/24 271 lb (122.9 kg)  03/02/24 267 lb 6.4 oz (121.3 kg)  02/21/24 267 lb (121.1 kg)      Physical Exam     Assessment & Plan:  SABRASABRAMelinda was seen today for medical management of chronic issues.  Diagnoses and all orders for this visit:  Moderate obstructive sleep apnea  PAF (paroxysmal atrial fibrillation) (HCC)  Mixed hyperlipidemia  Essential hypertension  Immunization due -     Flu vaccine trivalent PF, 6mos and older(Flulaval,Afluria,Fluarix,Fluzone) -     Pfizer Comirnaty Covid -19 Vaccine 41yrs and older -     Pneumococcal conjugate vaccine 20-valent (Prevnar 20)  Vitamin D deficiency -     Vitamin D (25 hydroxy)  Other infective acute otitis externa of left ear -     ciprofloxacin-dexamethasone  (CIPRODEX) OTIC suspension; Place 4 drops into the left ear 2 (two) times daily for 7 days.   Assessment & Plan  Routine visit with ongoing concerns. Blood pressure slightly elevated in office but controlled at home. Vaccinations current. - Continue current medications and lifestyle modifications. - Rechecked blood pressure and improved to 130/80. - Flu/Covid/Pneumonia administered today.   Paroxysmal atrial fibrillation Episodes of elevated heart rate during physical activity. Awaiting electrophysiologist consultation for potential rhythm conversion procedure. - Continue to avoid alcohol and caffeine. - Attend electrophysiologist appointment on November 25th. -  Discuss work situation with electrophysiologist for potential clearance. Continue FMLA forms to be out of work untile Dec 1st.   Obstructive sleep apnea Managed with CPAP therapy. Reports improved energy and sleep quality. Compliance confirmed with nightly use greater than 4 hours a night. DME company [employer]. - Continue CPAP therapy. -  Submitted compliance report to insurance for CPAP.  Essential hypertension Well-controlled with home readings. Slightly elevated in office, likely due to recent medication intake. - Continue current antihypertensive regimen. - Monitor blood pressure at home regularly.  Vitamin D deficiency Managed with high-dose vitamin D supplementation. Currently on weekly dosing for eight weeks. - Continue high-dose vitamin D supplementation will recheck today and determine medication regimen.   Left otitis externa - treated with ciprodex for 7 days     Ambreen Tufte, PA-C

## 2024-03-21 NOTE — Progress Notes (Signed)
 Vitamin D looks good. Lets transition to 5000 units a day of OTC vitamin D. Recheck in 3 months.

## 2024-04-03 ENCOUNTER — Encounter: Payer: Self-pay | Admitting: Student in an Organized Health Care Education/Training Program

## 2024-04-03 ENCOUNTER — Ambulatory Visit
Attending: Student in an Organized Health Care Education/Training Program | Admitting: Student in an Organized Health Care Education/Training Program

## 2024-04-03 VITALS — BP 116/69 | HR 91 | Ht 72.0 in | Wt 276.0 lb

## 2024-04-03 DIAGNOSIS — I480 Paroxysmal atrial fibrillation: Secondary | ICD-10-CM

## 2024-04-03 NOTE — Patient Instructions (Signed)
 Medication Instructions:  Your physician recommends that you continue on your current medications as directed. Please refer to the Current Medication list given to you today.  *If you need a refill on your cardiac medications before your next appointment, please call your pharmacy*  Lab Work: None ordered.  If you have labs (blood work) drawn today and your tests are completely normal, you will receive your results only by: MyChart Message (if you have MyChart) OR A paper copy in the mail If you have any lab test that is abnormal or we need to change your treatment, we will call you to review the results.  Testing/Procedures: None ordered.   Follow-Up: At [facility], you and your health needs are our priority.  As part of our continuing mission to provide you with exceptional heart care, our providers are all part of one team.  This team includes your primary Cardiologist (physician) and Advanced Practice Providers or APPs (Physician Assistants and Nurse Practitioners) who all work together to provide you with the care you need, when you need it.  Your next appointment:   12 months with Dr Almetta  Please contact our office if you decide to move forward with procedures.

## 2024-04-03 NOTE — Progress Notes (Signed)
 " Cardiology Office Note   Date:  04/04/2024  ID:  Ernest Fuentes, DOB Mar 28, 1966, MRN [id number] PCP: Antoniette Vermell LITTIE DEVONNA  [facility] Providers Cardiologist:  None Electrophysiologist:  Donnice DELENA Primus, MD   History of Present Illness Ernest Fuentes is a 58 y.o. male with paroxysmal AF, prior CVA, HLD, CAD, obesity who presents for AF management and stroke risk reduction. History of Present Illness He has a history of atrial fibrillation with episodes that resolve spontaneously. He does not perceive when he is in atrial fibrillation but experiences fatigue during these episodes. He is currently on Eliquis  for stroke prevention and metoprolol  for heart rate control.  Two months ago, he experienced two strokes simultaneously, resulting in visual issues, specifically lower peripheral vision loss on both sides, with the left side being worse. He has a history of high blood pressure, which is managed with medication. No diabetes.  He had an echocardiogram in 2021 and a recent one following his strokes. He has worn a heart monitor in the past, which showed he was in atrial fibrillation 26% of the time.  He has a history of an abnormal heartbeat since childhood, characterized by extra beats. He works as a scientist, water quality and lives in [place], [place] . His wife is a buyer, retail of [employer] and decided to stay in the area after graduating.  ROS: intermittent fatigue   Studies Reviewed  ECG review 01/04/24: SB 56, PR 168, QRS 84, QT/c 432/416 02/07/24: AF/RVR 131, QRS 82, QT/c 328/484 ([facility], per [facility])   cCT Result date: 02/24/24 1. No acute extra cardiac abnormality. 2. Small hiatal hernia. 3. Aortic atherosclerosis. TruPlan reviewed with following measurements:  Max 26.5/ AVG 25/ Depth 19 Likely use a 31mm device Inf/Mid TSP RAO 25/ CAU 15  TTE Result date: 02/08/24 Left Ventricle: Left ventricle size is normal. Systolic function is  mild to moderately  abnormal. EF: 40-45%.    Right Ventricle: Systolic function is grossly normal. Normal tricuspid  annular plane systolic excursion (TAPSE) >1.7 cm.    Pericardium: There is no pericardial effusion.   Zio monitor  Result date: 01/04/24-02/02/24 *The predominant rhythm was Sinus Rhythm.  *The Average Heart Rate was 80 BPM.  *There were 112,087 VE beats with a burden of 5 %. There were 286 occurrences of Ventricular Tachycardia with the longest episode 4s, 07:38 PM 9/8 and fastest episode 251 BPM, 02:44 AM 9/3. *There were 25,138 SVE beats with a burden of 1 %.  *The study included an Atrial Fibrillation/Flutter Burden of 26 %. The longest episode was 4h 30m 20s, 05:26 PM 9/5 and the fastest episode was 184 BPM, 01:27 PM 9/1. *There were 7 manually detected events  TTE Result date: 12/18/19  1. Left ventricular ejection fraction, by estimation, is 45 to 50%. The  left ventricle has mildly decreased function. The left ventricle  demonstrates global hypokinesis. The left ventricular internal cavity size  was mildly dilated. There is moderate  left ventricular hypertrophy. Left ventricular diastolic parameters were  normal.   2. Right ventricular systolic function is normal. The right ventricular  size is normal.   3. The mitral valve is normal in structure. Trivial mitral valve  regurgitation. No evidence of mitral stenosis.   4. The aortic valve is tricuspid. Aortic valve regurgitation is not  visualized. Mild aortic valve sclerosis is present, with no evidence of  aortic valve stenosis.   5. The inferior vena cava is normal in size with greater than 50%  respiratory variability, suggesting right atrial pressure of 3 mmHg.   Risk Assessment/Calculations  CHA2DS2-VASc Score = 4  This indicates a 4.8% annual risk of stroke. The patient's score is based upon: CHF History: 1 HTN History: 1 Diabetes History: 0 Stroke History: 2 Vascular Disease History: 0 Age Score: 0 Gender Score:  0  Physical Exam VS:  BP 116/69   Pulse 91   Ht 6' (1.829 m)   Wt 276 lb (125.2 kg)   SpO2 96%   BMI 37.43 kg/m       Wt Readings from Last 3 Encounters:  04/03/24 276 lb (125.2 kg)  03/20/24 271 lb (122.9 kg)  03/02/24 267 lb 6.4 oz (121.3 kg)    GEN: Well nourished, well developed in no acute distress NECK: No JVD; No carotid bruits CARDIAC: irregular rhythm, regular rate, no murmurs, rubs, gallops RESPIRATORY:  Clear to auscultation without rales, wheezing or rhonchi  ABDOMEN: Soft, non-tender, non-distended EXTREMITIES:  No edema; No deformity   ASSESSMENT AND PLAN Ernest Fuentes is a 58 y.o. male with paroxysmal AF, prior CVA, HLD, CAD, obesity who presents for AF management and stroke risk reduction. Assessment & Plan Atrial fibrillation with history of ischemic stroke and residual visual field deficit Intermittent AFib with ischemic stroke history. Stroke risk score 4, 4.8% annual stroke risk. On Eliquis . Discussed ablation and Watchman procedure. Risks of ablation include vascular and cardiac damage, stroke. Benefits include reduced AFib episodes and stroke risk. Emphasized shared decision-making.  He is currently in AF and has 26% burden on recent monitor.  Currently does not have LA enlargement but does have reduced LVEF that may be secondary to tachy induced cardiomyopathy.  He does have fatigue related to AF but does not have rhythm awareness.  He has Kardia device to monitor for episodes.  I recommended he pursue rhythm control given his young age and HFrEF.  We discussed different options for management of AF including rate control versus rhythm control with either antiarrhythmic medications or ablation.  I explained why rhythm control for him would be ideal.  We also reviewed associated stroke risk related to AF.  Unfortunately he has already had a stroke which has left him with peripheral visual field deficits but no strength deficits.  We reviewed left atrial appendage  occlusion as a way to potentially discontinue OAC long-term which he was interested in to minimize his medication burden.  He and his wife are going to take some time to think about this and consider further options.  I will call him in a week to follow-up on what they have decided.  He was leaning towards ablation with watchman implant during the same procedure if able to be scheduled by the end of the year would be ideal for them if they are interested in proceeding.  I did explain that he should take time to think about this but I would like to get better control of his arrhythmias within the next few months whether that be with medications or ablation. - Continue Eliquis  for stroke prevention. - Consider ablation and Watchman if AFib frequency increases or desire to stop blood thinners. - Provided contact information for further discussion.  Chronic systolic heart failure Mildly reduced ejection fraction, possibly related to AFib. No significant left atrial enlargement. Discussed AFib's impact on heart function. - Continue current heart failure management. - Monitor heart function and symptoms.  Hypertension Managed with medication. Discussed importance of blood pressure control for stroke risk reduction and heart failure  management. - Continue current antihypertensive medication regimen.  Discussed treatment options today for AF including antiarrhythmic drug therapy and ablation. Discussed risks, recovery and likelihood of success with each treatment strategy. Risk, benefits, and alternatives to EP study and ablation for afib were discussed. These risks include but are not limited to stroke, bleeding, vascular damage, tamponade, perforation, damage to the esophagus, lungs, phrenic nerve and other structures, worsening renal function, coronary vasospasm and death.  Discussed potential need for repeat ablation procedures and antiarrhythmic drugs after an initial ablation. The patient understands  these risk and wants some time to consider his options.  I have seen Ernest Fuentes in the office today who is being considered for a Watchman left atrial appendage closure device. I believe they will benefit from this procedure given their history of atrial fibrillation, CHA2DS2-VASc score of 4 and unadjusted ischemic stroke rate of 4.8% per year. Unfortunately, the patient is not felt to be a long term anticoagulation candidate secondary to patient preference for d/c OAC and potential concomitant procedure with ablation. The patient's chart has been reviewed and I feel that they would be a candidate for short term oral anticoagulation after Watchman implant.   It is my belief that after undergoing a LAA closure procedure, Ernest Fuentes will not need long term anticoagulation which eliminates anticoagulation side effects and major bleeding risk.   Procedural risks for the Watchman implant have been reviewed with the patient including a 0.5% risk of stroke, <1% risk of perforation and <1% risk of device embolization. Other risks include bleeding, vascular damage, tamponade, worsening renal function, and death. The patient understands these risk and wants to consider his options.   The published clinical data on the safety and effectiveness of WATCHMAN include but are not limited to the following: - Holmes DR, Jess BEARD, Sick P et al. for the PROTECT AF Investigators. Percutaneous closure of the left atrial appendage versus warfarin therapy for prevention of stroke in patients with atrial fibrillation: a randomised non-inferiority trial. Lancet 2009; 374: 534-42. GLENWOOD Jess BEARD, Doshi SK, Jonita VEAR Satchel D et al. on behalf of the PROTECT AF Investigators. Percutaneous Left Atrial Appendage Closure for Stroke Prophylaxis in Patients With Atrial Fibrillation 2.3-Year Follow-up of the PROTECT AF (Watchman Left Atrial Appendage System for Embolic Protection in Patients With Atrial Fibrillation) Trial. Circulation  2013; 127:720-729. - Alli O, Doshi S,  Kar S, Reddy VY, Sievert H et al. Quality of Life Assessment in the Randomized PROTECT AF (Percutaneous Closure of the Left Atrial Appendage Versus Warfarin Therapy for Prevention of Stroke in Patients With Atrial Fibrillation) Trial of Patients at Risk for Stroke With Nonvalvular Atrial Fibrillation. J Am Coll Cardiol 2013; 61:1790-8. GLENWOOD Satchel DR, Archer GORMAN, Price M, Whisenant B, Sievert H, Doshi S, Huber K, Reddy V. Prospective randomized evaluation of the Watchman left atrial appendage Device in patients with atrial fibrillation versus long-term warfarin therapy; the PREVAIL trial. Journal of the [employer] of Cardiology, Vol. 4, No. 1, 2014, 1-11. - Kar S, Doshi SK, Sadhu A, Horton R, Osorio J et al. Primary outcome evaluation of a next-generation left atrial appendage closure device: results from the PINNACLE FLX trial. Circulation 2021;143(18)1754-1762.   After today's visit with the patient which was dedicated solely for shared decision making visit regarding LAA closure device, the patient would like to consider his options related to ablation and LAAO.   If he decides to proceed with LAAO we will order a gated CT scan of the chest  with contrast timed for PV/LA visualization.   HAS-BLED score 1 Hypertension No  Abnormal renal and liver function (Dialysis, transplant, Cr >2.26 mg/dL /Cirrhosis or Bilirubin >2x Normal or AST/ALT/AP >3x Normal) No  Stroke Yes  Bleeding No  Labile INR (Unstable/high INR) No  Elderly (>65) No  Drugs or alcohol (>= 8 drinks/week, anti-plt or NSAID) No   CHA2DS2-VASc Score = 4  The patient's score is based upon: CHF History: 1 HTN History: 1 Diabetes History: 0 Stroke History: 2 Vascular Disease History: 0 Age Score: 0 Gender Score: 0   Dispo: Call in 1 week to f/u on decision regarding ablation and possible concomitant procedure, otherwise RTC 6 months   A total of 50 minutes was spent preparing for the  patient, reviewing history, performing exam, document encounter, coordinating care and counseling the patient. 35 minutes was spent with direct patient care.   Signed, Donnice DELENA Primus, MD  "

## 2024-04-10 ENCOUNTER — Telehealth: Payer: Self-pay | Admitting: Physician Assistant

## 2024-04-10 NOTE — Telephone Encounter (Signed)
 Spoke with patient.  Gave verbal permission to speak with Norleen Na regarding this matter.   Called and spoke with Norleen Na who transferred call to the NP with the office -  Was told that needed a clearance  after any traumatic or cardiovascular event to clear the patient to drive again  Gave return fax # [phone] to fax completed letter.

## 2024-04-10 NOTE — Telephone Encounter (Signed)
 Pt is requesting a Clearance letter for driving.   Ernest Fuentes [phone]-fjpw number [email or web address]

## 2024-04-10 NOTE — Telephone Encounter (Signed)
 Letter written and faxed to given number- confirmation received.

## 2024-04-16 ENCOUNTER — Telehealth: Payer: Self-pay | Admitting: Student in an Organized Health Care Education/Training Program

## 2024-04-16 DIAGNOSIS — I480 Paroxysmal atrial fibrillation: Secondary | ICD-10-CM

## 2024-04-16 NOTE — Telephone Encounter (Signed)
 Ernest Fuentes is calling on behalf of patient to schedule both an ablation and Watchman with Dr. Almetta. She requests this be completed before the end of the year as patient had met his deductible.  Ernest Fuentes states Dr. Elige said he would work it out to make that possible.   Thursdays and Fridays are the best days to schedule on. Not available next week as she is a runner, broadcasting/film/video and has exams next week, and would not be able help with transportation.  Informed Ernest Fuentes I will forward message to Dr. Garnette nurse and EP procedure scheduler to contact her to schedule procedures.  Best callback number [phone]

## 2024-04-16 NOTE — Telephone Encounter (Signed)
 Wife called in and stated pt would like to move forward with Scheduling procedures suggested but Dr Almetta Everts or Friday would be the best days  Best number [phone]

## 2024-04-17 ENCOUNTER — Other Ambulatory Visit: Payer: Self-pay

## 2024-04-17 DIAGNOSIS — I480 Paroxysmal atrial fibrillation: Secondary | ICD-10-CM

## 2024-04-17 NOTE — Telephone Encounter (Signed)
 Spoke with patient's wife. Arranged concomitant ablation/watchman 05/08/24. They are aware patient needs labs prior at [facility]. Provided information for [place].   Patient will use Dial soap.   Wife is aware they are to notify office if patient develops illness or attends ER/Urgent Care prior to 12/30.

## 2024-04-19 ENCOUNTER — Telehealth: Payer: Self-pay

## 2024-04-19 NOTE — Telephone Encounter (Signed)
 Ernest Fuentes

## 2024-04-23 ENCOUNTER — Ambulatory Visit: Admitting: Family Medicine

## 2024-04-23 ENCOUNTER — Encounter: Payer: Self-pay | Admitting: Family Medicine

## 2024-04-23 VITALS — BP 134/82 | HR 89 | Temp 99.2°F | Ht 72.0 in | Wt 272.0 lb

## 2024-04-23 DIAGNOSIS — J0110 Acute frontal sinusitis, unspecified: Secondary | ICD-10-CM | POA: Diagnosis not present

## 2024-04-23 MED ORDER — AMOXICILLIN-POT CLAVULANATE 875-125 MG PO TABS: 1.0000 | ORAL_TABLET | Freq: Two times a day (BID) | ORAL | 0 refills | Status: DC

## 2024-04-23 NOTE — Patient Instructions (Signed)
 Try to eat a few cups of yogurt this week to help you gut, while taking antibiotics.

## 2024-04-23 NOTE — Progress Notes (Signed)
 Acute Office Visit  Patient ID: Ernest Fuentes, male    DOB: December 21, 1965, 58 y.o.   MRN: [id number]  PCP: Antoniette Vermell CROME, PA-C  Chief Complaint  Patient presents with   Cough    X 3weeks, pt reports that he has been coughing up lots of mucus. He has been taking Mucinex, denies any f/s/c    Subjective:     HPI  Discussed the use of AI scribe software for clinical note transcription with the patient, who gave verbal consent to proceed.  History of Present Illness Ernest Fuentes is a 58 year old male who presents with sinus congestion and ear discomfort persisting for three weeks.  Sinus congestion - Sinus congestion present for approximately three weeks - Initial partial improvement with Mucinex, but symptoms persist - Occasional headaches associated with congestion - No fever during this period  Ear discomfort - Bilateral ear discomfort, left ear more affected - Sensation of ears feeling 'stopped up' - Minimal pain reported - Left ear possibly more affected due to sleeping position  Cough and sputum production - Cough with sputum production - Sputum initially colored, now white after taking Mucinex - Frequent sensation of drainage, which is expectorated  Wheezing - Brief episode of wheezing lasting two to three days, now resolved  Cpap use - Regular use of CPAP machine   ROS     Objective:    BP 134/82   Pulse 89   Temp 99.2 F (37.3 C) (Oral)   Ht 6' (1.829 m)   Wt 272 lb 0.6 oz (123.4 kg)   SpO2 100%   BMI 36.90 kg/m    Physical Exam Constitutional:      Appearance: Normal appearance.  HENT:     Head: Normocephalic and atraumatic.     Right Ear: Tympanic membrane, ear canal and external ear normal. There is no impacted cerumen.     Left Ear: Tympanic membrane, ear canal and external ear normal. There is no impacted cerumen.     Ears:     Comments: Partial blockage of TMS by cerumen    Nose: Nose normal.     Mouth/Throat:     Pharynx: Oropharynx  is clear.  Eyes:     Conjunctiva/sclera: Conjunctivae normal.  Cardiovascular:     Rate and Rhythm: Normal rate and regular rhythm.  Pulmonary:     Effort: Pulmonary effort is normal.     Breath sounds: Normal breath sounds.  Musculoskeletal:     Cervical back: Neck supple. No tenderness.  Lymphadenopathy:     Cervical: No cervical adenopathy.  Skin:    General: Skin is warm and dry.  Neurological:     Mental Status: He is alert and oriented to person, place, and time.  Psychiatric:        Mood and Affect: Mood normal.       No results found for any visits on 04/23/24.     Assessment & Plan:   Problem List Items Addressed This Visit   None Visit Diagnoses       Acute non-recurrent frontal sinusitis    -  Primary   Relevant Medications   amoxicillin -clavulanate (AUGMENTIN ) 875-125 MG tablet       Assessment and Plan Assessment & Plan Acute sinusitis Symptoms suggest sinus infection with post-nasal drainage causing cough. - Prescribed Augmentin  1 tablet twice daily for 7 days. - Advised daily yogurt intake to prevent antibiotic-related gastrointestinal side effects. - call if not better in one week.  Meds ordered this encounter  Medications   amoxicillin -clavulanate (AUGMENTIN ) 875-125 MG tablet    Sig: Take 1 tablet by mouth 2 (two) times daily.    Dispense:  14 tablet    Refill:  0    Return if symptoms worsen or fail to improve.  Dorothyann Byars, MD [facility] at [facility]

## 2024-05-01 ENCOUNTER — Telehealth: Payer: Self-pay

## 2024-05-01 NOTE — Telephone Encounter (Signed)
 Left voicemail for patient to return call to discuss pre-procedure instructions. He is scheduled for concomitant ablation/watchman 05/08/24.  Of note, patient has not yet gone for labs. He was treated by PCP with abx for sinusitis with antibiotics 04/23/24.

## 2024-05-01 NOTE — Telephone Encounter (Signed)
 Spoke with patient's wife. She reports she had patient be seen for sinus concern because they wanted to make sure he wouldn't have delays in getting his procedure. Patient had coughing and congestion. He never had fever or SOB. Patient finished antibiotics yesterday. He no longer has congestion or cough. Explained to wife will need to speak with someone on anesthesia team to ensure this would not cause delay in his procedure.   Will follow up with them tomorrow. Patient is going for labs today after work.

## 2024-05-02 NOTE — Telephone Encounter (Signed)
 Spoke with patient's wife:  Confirmed procedure date of 05/08/2024. Confirmed arrival time of 0730 for procedure time at 10. Reviewed pre-procedure instructions with patient. Contrast allergy? No PPM or defibrillator? No Discussed possibility of being cancelled by anesthesia. Patient is supposed to go to labs today. The patient's wife understands to call if questions/concerns arise prior to procedure. The patient's wife was grateful for call and agreed with plan.

## 2024-05-04 NOTE — Telephone Encounter (Signed)
 Patient called in. He decided he does not want to pursue concomitant ablation/watchman right now as he is still feeling unwell. He will call when he feels better to discuss a new date and pre-procedure OV. Will update Dr. Almetta.

## 2024-05-08 ENCOUNTER — Encounter (HOSPITAL_COMMUNITY): Admission: RE | Payer: Self-pay

## 2024-05-08 ENCOUNTER — Inpatient Hospital Stay (HOSPITAL_COMMUNITY)
Admission: RE | Admit: 2024-05-08 | Source: Home / Self Care | Admitting: Student in an Organized Health Care Education/Training Program

## 2024-05-08 SURGERY — ATRIAL FIBRILLATION ABLATION
Anesthesia: General

## 2024-05-09 ENCOUNTER — Other Ambulatory Visit: Payer: Self-pay | Admitting: Physician Assistant

## 2024-05-09 DIAGNOSIS — E782 Mixed hyperlipidemia: Secondary | ICD-10-CM

## 2024-05-16 ENCOUNTER — Encounter: Payer: Self-pay | Admitting: Physician Assistant

## 2024-05-16 ENCOUNTER — Ambulatory Visit: Admitting: Physician Assistant

## 2024-05-16 VITALS — BP 135/79 | HR 80 | Ht 72.0 in | Wt 270.0 lb

## 2024-05-16 DIAGNOSIS — G4733 Obstructive sleep apnea (adult) (pediatric): Secondary | ICD-10-CM | POA: Diagnosis not present

## 2024-05-16 DIAGNOSIS — I10 Essential (primary) hypertension: Secondary | ICD-10-CM

## 2024-05-16 DIAGNOSIS — I480 Paroxysmal atrial fibrillation: Secondary | ICD-10-CM | POA: Diagnosis not present

## 2024-05-16 DIAGNOSIS — E782 Mixed hyperlipidemia: Secondary | ICD-10-CM

## 2024-05-16 MED ORDER — METOPROLOL TARTRATE 100 MG PO TABS: 100.0000 mg | ORAL_TABLET | Freq: Two times a day (BID) | ORAL | 1 refills | Status: AC

## 2024-05-16 NOTE — Patient Instructions (Signed)
 Keep Follow ups with cardiology.  Stay active 150 minutes a week!

## 2024-05-16 NOTE — Progress Notes (Signed)
 "  Established Patient Office Visit  Subjective   Patient ID: Ernest Fuentes, male    DOB: 1965-06-01  Age: 59 y.o. MRN: [id number]  Chief Complaint  Patient presents with   Medical Management of Chronic Issues    HPI .Discussed the use of AI scribe software for clinical note transcription with the patient, who gave verbal consent to proceed.  History of Present Illness Ernest Fuentes is a 59 year old male with atrial fibrillation, HTN, OSA, HLD who presents for a routine follow-up visit.  Atrial fibrillation and cardiac symptoms - Atrial fibrillation managed with Eliquis  and metoprolol  - No recent episodes of atrial fibrillation that patient has felt - Canceled planned Watchman ablation, continuing medication management - Occasional chest pain that is intermittent and not severe - No lower extremity swelling - Monitors heart rate at home; wife attempted to obtain an apple watch for improved monitoring  Physical activity - Physically active, rides a bike approximately three times per week - Decrease in activity during the holidays  Sleep apnea and sleep quality - Uses CPAP machine nightly - Significant improvement in sleep quality and energy levels with CPAP  Respiratory symptoms - Recent cough, currently improving - Uses Flonase for nasal congestion    ROS See HPI.    Objective:     BP 135/79   Pulse 80   Ht 6' (1.829 m)   Wt 270 lb (122.5 kg)   SpO2 98%   BMI 36.62 kg/m  BP Readings from Last 3 Encounters:  05/16/24 135/79  04/23/24 134/82  04/03/24 116/69   Wt Readings from Last 3 Encounters:  05/16/24 270 lb (122.5 kg)  04/23/24 272 lb 0.6 oz (123.4 kg)  04/03/24 276 lb (125.2 kg)      Physical Exam Constitutional:      Appearance: Normal appearance. He is obese.  HENT:     Head: Normocephalic.  Neck:     Vascular: No carotid bruit.  Cardiovascular:     Rate and Rhythm: Normal rate. Rhythm irregular.  Pulmonary:     Effort: Pulmonary effort  is normal.     Breath sounds: Normal breath sounds.  Musculoskeletal:     Cervical back: Neck supple.     Right lower leg: No edema.     Left lower leg: No edema.  Neurological:     General: No focal deficit present.     Mental Status: He is alert and oriented to person, place, and time.  Psychiatric:        Mood and Affect: Mood normal.        Assessment & Plan:  Ernest Fuentes was seen today for medical management of chronic issues.  Diagnoses and all orders for this visit:  PAF (paroxysmal atrial fibrillation) (HCC) -     metoprolol  tartrate (LOPRESSOR ) 100 MG tablet; Take 1 tablet (100 mg total) by mouth 2 (two) times daily.  Mixed hyperlipidemia  Essential hypertension -     metoprolol  tartrate (LOPRESSOR ) 100 MG tablet; Take 1 tablet (100 mg total) by mouth 2 (two) times daily.   Assessment & Plan Paroxysmal atrial fibrillation Managed by cardiology. Patient denies any irregular rhythms that he has felt. He was not in normal rhythm today but asymptomatic. He postponed Watchman/Ablation procedure and ablation to strengthen his body, preferring medication management. Watchman could allow discontinuation of blood thinners. - Continue Eliquis , metoprolol , and rosuvastatin . - Discuss Watchman procedure and ablation with cardiology at next follow-up with cardiology.   Essential hypertension Blood pressure  well-controlled at 135/79 mmHg. - Continue current antihypertensive regimen.  Mixed hyperlipidemia Managed with rosuvastatin . - Continue rosuvastatin .  Obstructive sleep apnea Managed with CPAP, improving sleep quality and energy levels. - Continue CPAP therapy.  General health maintenance Up to date on colonoscopy. Engages in regular physical activity. - Continue regular physical activity. - Ensure all other routine health maintenance is up to date.     Return in about 6 months (around 11/13/2024).    Kameren Pargas, PA-C  "

## 2024-05-29 NOTE — Telephone Encounter (Signed)
 Patient was cancelled PVI/LAAO 05/08/25 due to illness.  Called patient to check in and possibly reschedule procedure if he was interested. Left message for return call.

## 2024-06-08 ENCOUNTER — Ambulatory Visit (HOSPITAL_COMMUNITY): Admitting: Internal Medicine

## 2024-11-14 ENCOUNTER — Ambulatory Visit: Admitting: Physician Assistant
